# Patient Record
Sex: Female | Born: 2000 | Race: Black or African American | Hispanic: No | Marital: Single | Smoking: Never smoker
Health system: Southern US, Community
[De-identification: ages and names within clinical notes are randomized; demographics above are authoritative.]

## PROBLEM LIST (undated history)

## (undated) DIAGNOSIS — T7840XA Allergy, unspecified, initial encounter: Secondary | ICD-10-CM

## (undated) DIAGNOSIS — D649 Anemia, unspecified: Secondary | ICD-10-CM

## (undated) HISTORY — DX: Anemia, unspecified: D64.9

## (undated) HISTORY — DX: Allergy, unspecified, initial encounter: T78.40XA

---
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
## 2019-02-13 DIAGNOSIS — Z118 Encounter for screening for other infectious and parasitic diseases: Secondary | ICD-10-CM | POA: Diagnosis not present

## 2019-02-13 DIAGNOSIS — Z6822 Body mass index (BMI) 22.0-22.9, adult: Secondary | ICD-10-CM | POA: Diagnosis not present

## 2019-02-13 DIAGNOSIS — N760 Acute vaginitis: Secondary | ICD-10-CM | POA: Diagnosis not present

## 2019-02-13 DIAGNOSIS — Z01419 Encounter for gynecological examination (general) (routine) without abnormal findings: Secondary | ICD-10-CM | POA: Diagnosis not present

## 2019-03-06 DIAGNOSIS — N72 Inflammatory disease of cervix uteri: Secondary | ICD-10-CM | POA: Diagnosis not present

## 2019-03-06 DIAGNOSIS — N760 Acute vaginitis: Secondary | ICD-10-CM | POA: Diagnosis not present

## 2019-03-18 ENCOUNTER — Other Ambulatory Visit: Payer: Self-pay

## 2019-03-18 ENCOUNTER — Ambulatory Visit (HOSPITAL_COMMUNITY)
Admission: EM | Admit: 2019-03-18 | Discharge: 2019-03-18 | Disposition: A | Discharge status: Home / Self Care | Payer: Federal, State, Local not specified - PPO | Attending: Family Medicine | Admitting: Family Medicine

## 2019-03-18 ENCOUNTER — Encounter (HOSPITAL_COMMUNITY): Payer: Self-pay

## 2019-03-18 DIAGNOSIS — Z20828 Contact with and (suspected) exposure to other viral communicable diseases: Secondary | ICD-10-CM | POA: Diagnosis not present

## 2019-03-18 DIAGNOSIS — Z20822 Contact with and (suspected) exposure to covid-19: Secondary | ICD-10-CM

## 2019-03-18 NOTE — ED Triage Notes (Signed)
Pt presents for Covid Testing after someone at her fathers job tested positive for Covid.

## 2019-03-18 NOTE — Discharge Instructions (Signed)
Continue to discuss with your work about policy of asymptomatic possible covid exposure and isolation requirements.  Will notify you of any positive findings from your covid testing. You may monitor your results on your MyChart online as well.

## 2019-03-18 NOTE — ED Provider Notes (Signed)
[facility]    CSN: [id number] Arrival date & time: 03/18/19  0915      History   Chief Complaint Chief Complaint  Patient presents with  . COVID EXPOSURE    HPI Elif Yonts is a 18 y.o. female.   Debroah Loop presents with concerns of exposure to Covid-19. Her father had a coworker who learned of a positive test two days ago. Her father has been feeling unwell, with diarrhea and body aches. She feels normal. Denies any URI symptoms, gi symptoms, fevers, body aches, headaches, loss of taste or smell. She works at [employer], however, therefore would like testing to ensure negative. Without contributing medical history.      ROS per HPI, negative if not otherwise mentioned.      Past Medical History:  Diagnosis Date  . Allergy   . Anemia     Patient Active Problem List   Diagnosis Date Noted  . Seasonal allergic rhinitis due to pollen 02/09/2017    History reviewed. No pertinent surgical history.  OB History   No obstetric history on file.      Home Medications    Prior to Admission medications   Medication Sig Start Date End Date Taking? Authorizing Provider  cetirizine-pseudoephedrine (ZYRTEC-D) 5-120 MG tablet Take 1 tablet by mouth daily. 10/09/18   Tasia Catchings, Amy V, PA-C  doxycycline (VIBRA-TABS) 100 MG tablet Take 1 tablet (100 mg total) by mouth 2 (two) times daily. 11/02/18   Panosh, Standley Brooking, MD  ipratropium (ATROVENT) 0.06 % nasal spray Place 2 sprays into both nostrils 4 (four) times daily. 10/09/18   Ok Edwards, PA-C    Family History Family History  Problem Relation Age of Onset  . Diabetes Mother        type 2   . Heart disease Father         cabd in 8s   . Crohn's disease Maternal Uncle   . Breast cancer Maternal Grandmother     Social History Social History   Tobacco Use  . Smoking status: Never Smoker  . Smokeless tobacco: Never Used  Substance Use Topics  . Alcohol use: No  . Drug use: Not on file      Allergies   Amoxicillin and Penicillins   Review of Systems Review of Systems   Physical Exam Triage Vital Signs ED Triage Vitals  Enc Vitals Group     BP 03/18/19 0947 (!) 122/94     Pulse Rate 03/18/19 0947 85     Resp 03/18/19 0947 17     Temp 03/18/19 0947 99.2 F (37.3 C)     Temp Source 03/18/19 0947 Oral     SpO2 03/18/19 0947 100 %     Weight --      Height --      Head Circumference --      Peak Flow --      Pain Score 03/18/19 0949 0     Pain Loc --      Pain Edu? --      Excl. in [place]? --    No data found.  Updated Vital Signs BP (!) 122/94 (BP Location: Left Arm)   Pulse 85   Temp 99.2 F (37.3 C) (Oral)   Resp 17   LMP 03/17/2019   SpO2 100%   Visual Acuity Right Eye Distance:   Left Eye Distance:   Bilateral Distance:    Right Eye Near:   Left Eye Near:  Bilateral Near:     Physical Exam Constitutional:      General: She is not in acute distress.    Appearance: She is well-developed.  Cardiovascular:     Rate and Rhythm: Normal rate.  Pulmonary:     Effort: Pulmonary effort is normal.  Skin:    General: Skin is warm and dry.  Neurological:     Mental Status: She is alert and oriented to person, place, and time.      UC Treatments / Results  Labs (all labs ordered are listed, but only abnormal results are displayed) Labs Reviewed  NOVEL CORONAVIRUS, NAA (HOSPITAL ORDER, SEND-OUT TO REF LAB)    EKG   Radiology No results found.  Procedures Procedures (including critical care time)  Medications Ordered in UC Medications - No data to display  Initial Impression / Assessment and Plan / UC Course  I have reviewed the triage vital signs and the nursing notes.  Pertinent labs & imaging results that were available during my care of the patient were reviewed by me and considered in my medical decision making (see chart for details).     Non toxic. Benign physical exam.  Asymptomatic. Possible covid-19 exposure, works in  [employer]. Testing provided. Will notify of any positive findings. Return precautions provided. Patient verbalized understanding and agreeable to plan.    Final Clinical Impressions(s) / UC Diagnoses   Final diagnoses:  Exposure to Covid-19 Virus     Discharge Instructions     Continue to discuss with your work about policy of asymptomatic possible covid exposure and isolation requirements.  Will notify you of any positive findings from your covid testing. You may monitor your results on your MyChart online as well.     ED Prescriptions    None     Controlled Substance Prescriptions [place] Controlled Substance Registry consulted? Not Applicable   Georgetta HaberBurky,  B, NP 03/18/19 1015

## 2019-03-20 LAB — NOVEL CORONAVIRUS, NAA (HOSP ORDER, SEND-OUT TO REF LAB; TAT 18-24 HRS): SARS-CoV-2, NAA: NOT DETECTED

## 2019-04-26 DIAGNOSIS — M9903 Segmental and somatic dysfunction of lumbar region: Secondary | ICD-10-CM | POA: Diagnosis not present

## 2019-04-26 DIAGNOSIS — M5386 Other specified dorsopathies, lumbar region: Secondary | ICD-10-CM | POA: Diagnosis not present

## 2019-04-26 DIAGNOSIS — M9902 Segmental and somatic dysfunction of thoracic region: Secondary | ICD-10-CM | POA: Diagnosis not present

## 2019-04-26 DIAGNOSIS — M9904 Segmental and somatic dysfunction of sacral region: Secondary | ICD-10-CM | POA: Diagnosis not present

## 2019-04-29 DIAGNOSIS — N926 Irregular menstruation, unspecified: Secondary | ICD-10-CM | POA: Diagnosis not present

## 2019-04-29 DIAGNOSIS — Z3200 Encounter for pregnancy test, result unknown: Secondary | ICD-10-CM | POA: Diagnosis not present

## 2019-04-29 DIAGNOSIS — Z3689 Encounter for other specified antenatal screening: Secondary | ICD-10-CM | POA: Diagnosis not present

## 2019-04-30 DIAGNOSIS — M9904 Segmental and somatic dysfunction of sacral region: Secondary | ICD-10-CM | POA: Diagnosis not present

## 2019-04-30 DIAGNOSIS — M5386 Other specified dorsopathies, lumbar region: Secondary | ICD-10-CM | POA: Diagnosis not present

## 2019-04-30 DIAGNOSIS — M9902 Segmental and somatic dysfunction of thoracic region: Secondary | ICD-10-CM | POA: Diagnosis not present

## 2019-04-30 DIAGNOSIS — M9903 Segmental and somatic dysfunction of lumbar region: Secondary | ICD-10-CM | POA: Diagnosis not present

## 2019-05-01 DIAGNOSIS — N926 Irregular menstruation, unspecified: Secondary | ICD-10-CM | POA: Diagnosis not present

## 2019-05-02 DIAGNOSIS — M9902 Segmental and somatic dysfunction of thoracic region: Secondary | ICD-10-CM | POA: Diagnosis not present

## 2019-05-02 DIAGNOSIS — M5386 Other specified dorsopathies, lumbar region: Secondary | ICD-10-CM | POA: Diagnosis not present

## 2019-05-02 DIAGNOSIS — M9904 Segmental and somatic dysfunction of sacral region: Secondary | ICD-10-CM | POA: Diagnosis not present

## 2019-05-02 DIAGNOSIS — M9903 Segmental and somatic dysfunction of lumbar region: Secondary | ICD-10-CM | POA: Diagnosis not present

## 2019-05-03 DIAGNOSIS — M5386 Other specified dorsopathies, lumbar region: Secondary | ICD-10-CM | POA: Diagnosis not present

## 2019-05-03 DIAGNOSIS — M9904 Segmental and somatic dysfunction of sacral region: Secondary | ICD-10-CM | POA: Diagnosis not present

## 2019-05-03 DIAGNOSIS — M9903 Segmental and somatic dysfunction of lumbar region: Secondary | ICD-10-CM | POA: Diagnosis not present

## 2019-05-03 DIAGNOSIS — M9902 Segmental and somatic dysfunction of thoracic region: Secondary | ICD-10-CM | POA: Diagnosis not present

## 2019-05-03 DIAGNOSIS — N926 Irregular menstruation, unspecified: Secondary | ICD-10-CM | POA: Diagnosis not present

## 2019-05-07 DIAGNOSIS — M5386 Other specified dorsopathies, lumbar region: Secondary | ICD-10-CM | POA: Diagnosis not present

## 2019-05-07 DIAGNOSIS — M9904 Segmental and somatic dysfunction of sacral region: Secondary | ICD-10-CM | POA: Diagnosis not present

## 2019-05-07 DIAGNOSIS — M9903 Segmental and somatic dysfunction of lumbar region: Secondary | ICD-10-CM | POA: Diagnosis not present

## 2019-05-07 DIAGNOSIS — M9902 Segmental and somatic dysfunction of thoracic region: Secondary | ICD-10-CM | POA: Diagnosis not present

## 2019-05-09 DIAGNOSIS — M9902 Segmental and somatic dysfunction of thoracic region: Secondary | ICD-10-CM | POA: Diagnosis not present

## 2019-05-09 DIAGNOSIS — M9904 Segmental and somatic dysfunction of sacral region: Secondary | ICD-10-CM | POA: Diagnosis not present

## 2019-05-09 DIAGNOSIS — M9903 Segmental and somatic dysfunction of lumbar region: Secondary | ICD-10-CM | POA: Diagnosis not present

## 2019-05-09 DIAGNOSIS — M5386 Other specified dorsopathies, lumbar region: Secondary | ICD-10-CM | POA: Diagnosis not present

## 2019-05-10 DIAGNOSIS — M5386 Other specified dorsopathies, lumbar region: Secondary | ICD-10-CM | POA: Diagnosis not present

## 2019-05-10 DIAGNOSIS — N926 Irregular menstruation, unspecified: Secondary | ICD-10-CM | POA: Diagnosis not present

## 2019-05-10 DIAGNOSIS — M9902 Segmental and somatic dysfunction of thoracic region: Secondary | ICD-10-CM | POA: Diagnosis not present

## 2019-05-10 DIAGNOSIS — M9903 Segmental and somatic dysfunction of lumbar region: Secondary | ICD-10-CM | POA: Diagnosis not present

## 2019-05-10 DIAGNOSIS — M9904 Segmental and somatic dysfunction of sacral region: Secondary | ICD-10-CM | POA: Diagnosis not present

## 2019-05-14 DIAGNOSIS — M9902 Segmental and somatic dysfunction of thoracic region: Secondary | ICD-10-CM | POA: Diagnosis not present

## 2019-05-14 DIAGNOSIS — M9904 Segmental and somatic dysfunction of sacral region: Secondary | ICD-10-CM | POA: Diagnosis not present

## 2019-05-14 DIAGNOSIS — M5386 Other specified dorsopathies, lumbar region: Secondary | ICD-10-CM | POA: Diagnosis not present

## 2019-05-14 DIAGNOSIS — M9903 Segmental and somatic dysfunction of lumbar region: Secondary | ICD-10-CM | POA: Diagnosis not present

## 2019-05-17 DIAGNOSIS — M9902 Segmental and somatic dysfunction of thoracic region: Secondary | ICD-10-CM | POA: Diagnosis not present

## 2019-05-17 DIAGNOSIS — M9904 Segmental and somatic dysfunction of sacral region: Secondary | ICD-10-CM | POA: Diagnosis not present

## 2019-05-17 DIAGNOSIS — M9903 Segmental and somatic dysfunction of lumbar region: Secondary | ICD-10-CM | POA: Diagnosis not present

## 2019-05-17 DIAGNOSIS — M5386 Other specified dorsopathies, lumbar region: Secondary | ICD-10-CM | POA: Diagnosis not present

## 2019-05-20 DIAGNOSIS — Z3201 Encounter for pregnancy test, result positive: Secondary | ICD-10-CM | POA: Diagnosis not present

## 2019-05-28 DIAGNOSIS — M9902 Segmental and somatic dysfunction of thoracic region: Secondary | ICD-10-CM | POA: Diagnosis not present

## 2019-05-28 DIAGNOSIS — M5386 Other specified dorsopathies, lumbar region: Secondary | ICD-10-CM | POA: Diagnosis not present

## 2019-05-28 DIAGNOSIS — M9903 Segmental and somatic dysfunction of lumbar region: Secondary | ICD-10-CM | POA: Diagnosis not present

## 2019-05-28 DIAGNOSIS — M9904 Segmental and somatic dysfunction of sacral region: Secondary | ICD-10-CM | POA: Diagnosis not present

## 2019-05-31 DIAGNOSIS — M9904 Segmental and somatic dysfunction of sacral region: Secondary | ICD-10-CM | POA: Diagnosis not present

## 2019-05-31 DIAGNOSIS — M5386 Other specified dorsopathies, lumbar region: Secondary | ICD-10-CM | POA: Diagnosis not present

## 2019-05-31 DIAGNOSIS — M9903 Segmental and somatic dysfunction of lumbar region: Secondary | ICD-10-CM | POA: Diagnosis not present

## 2019-05-31 DIAGNOSIS — M9902 Segmental and somatic dysfunction of thoracic region: Secondary | ICD-10-CM | POA: Diagnosis not present

## 2019-06-03 DIAGNOSIS — M9904 Segmental and somatic dysfunction of sacral region: Secondary | ICD-10-CM | POA: Diagnosis not present

## 2019-06-03 DIAGNOSIS — M9902 Segmental and somatic dysfunction of thoracic region: Secondary | ICD-10-CM | POA: Diagnosis not present

## 2019-06-03 DIAGNOSIS — M9903 Segmental and somatic dysfunction of lumbar region: Secondary | ICD-10-CM | POA: Diagnosis not present

## 2019-06-03 DIAGNOSIS — M5386 Other specified dorsopathies, lumbar region: Secondary | ICD-10-CM | POA: Diagnosis not present

## 2019-06-11 DIAGNOSIS — M9904 Segmental and somatic dysfunction of sacral region: Secondary | ICD-10-CM | POA: Diagnosis not present

## 2019-06-11 DIAGNOSIS — M9903 Segmental and somatic dysfunction of lumbar region: Secondary | ICD-10-CM | POA: Diagnosis not present

## 2019-06-11 DIAGNOSIS — M5386 Other specified dorsopathies, lumbar region: Secondary | ICD-10-CM | POA: Diagnosis not present

## 2019-06-11 DIAGNOSIS — M9902 Segmental and somatic dysfunction of thoracic region: Secondary | ICD-10-CM | POA: Diagnosis not present

## 2019-06-13 DIAGNOSIS — Z1159 Encounter for screening for other viral diseases: Secondary | ICD-10-CM | POA: Diagnosis not present

## 2019-06-13 DIAGNOSIS — Z114 Encounter for screening for human immunodeficiency virus [HIV]: Secondary | ICD-10-CM | POA: Diagnosis not present

## 2019-06-13 DIAGNOSIS — Z309 Encounter for contraceptive management, unspecified: Secondary | ICD-10-CM | POA: Diagnosis not present

## 2019-06-13 DIAGNOSIS — Z113 Encounter for screening for infections with a predominantly sexual mode of transmission: Secondary | ICD-10-CM | POA: Diagnosis not present

## 2019-06-13 DIAGNOSIS — Z118 Encounter for screening for other infectious and parasitic diseases: Secondary | ICD-10-CM | POA: Diagnosis not present

## 2019-06-13 LAB — HM HEPATITIS C SCREENING LAB: HM Hepatitis Screen: NEGATIVE

## 2019-06-18 DIAGNOSIS — M5386 Other specified dorsopathies, lumbar region: Secondary | ICD-10-CM | POA: Diagnosis not present

## 2019-06-18 DIAGNOSIS — M9903 Segmental and somatic dysfunction of lumbar region: Secondary | ICD-10-CM | POA: Diagnosis not present

## 2019-06-18 DIAGNOSIS — M9904 Segmental and somatic dysfunction of sacral region: Secondary | ICD-10-CM | POA: Diagnosis not present

## 2019-06-18 DIAGNOSIS — M9902 Segmental and somatic dysfunction of thoracic region: Secondary | ICD-10-CM | POA: Diagnosis not present

## 2019-06-20 DIAGNOSIS — Z3689 Encounter for other specified antenatal screening: Secondary | ICD-10-CM | POA: Diagnosis not present

## 2019-06-20 DIAGNOSIS — Z3201 Encounter for pregnancy test, result positive: Secondary | ICD-10-CM | POA: Diagnosis not present

## 2019-06-20 DIAGNOSIS — Z0389 Encounter for observation for other suspected diseases and conditions ruled out: Secondary | ICD-10-CM | POA: Diagnosis not present

## 2019-06-24 DIAGNOSIS — Z3201 Encounter for pregnancy test, result positive: Secondary | ICD-10-CM | POA: Diagnosis not present

## 2019-06-25 DIAGNOSIS — M9903 Segmental and somatic dysfunction of lumbar region: Secondary | ICD-10-CM | POA: Diagnosis not present

## 2019-06-25 DIAGNOSIS — M9904 Segmental and somatic dysfunction of sacral region: Secondary | ICD-10-CM | POA: Diagnosis not present

## 2019-06-25 DIAGNOSIS — M5386 Other specified dorsopathies, lumbar region: Secondary | ICD-10-CM | POA: Diagnosis not present

## 2019-06-25 DIAGNOSIS — M9902 Segmental and somatic dysfunction of thoracic region: Secondary | ICD-10-CM | POA: Diagnosis not present

## 2019-07-02 DIAGNOSIS — M9904 Segmental and somatic dysfunction of sacral region: Secondary | ICD-10-CM | POA: Diagnosis not present

## 2019-07-02 DIAGNOSIS — M9903 Segmental and somatic dysfunction of lumbar region: Secondary | ICD-10-CM | POA: Diagnosis not present

## 2019-07-02 DIAGNOSIS — M9902 Segmental and somatic dysfunction of thoracic region: Secondary | ICD-10-CM | POA: Diagnosis not present

## 2019-07-02 DIAGNOSIS — M5386 Other specified dorsopathies, lumbar region: Secondary | ICD-10-CM | POA: Diagnosis not present

## 2019-07-08 DIAGNOSIS — Z3201 Encounter for pregnancy test, result positive: Secondary | ICD-10-CM | POA: Diagnosis not present

## 2019-07-22 DIAGNOSIS — Z3201 Encounter for pregnancy test, result positive: Secondary | ICD-10-CM | POA: Diagnosis not present

## 2019-08-07 ENCOUNTER — Ambulatory Visit (HOSPITAL_COMMUNITY)
Admission: EM | Admit: 2019-08-07 | Discharge: 2019-08-07 | Disposition: A | Discharge status: Home / Self Care | Payer: Federal, State, Local not specified - PPO | Attending: Family Medicine | Admitting: Family Medicine

## 2019-08-07 ENCOUNTER — Encounter (HOSPITAL_COMMUNITY): Payer: Self-pay | Admitting: Emergency Medicine

## 2019-08-07 ENCOUNTER — Other Ambulatory Visit: Payer: Self-pay

## 2019-08-07 DIAGNOSIS — Z20828 Contact with and (suspected) exposure to other viral communicable diseases: Secondary | ICD-10-CM | POA: Diagnosis not present

## 2019-08-07 DIAGNOSIS — R05 Cough: Secondary | ICD-10-CM | POA: Insufficient documentation

## 2019-08-07 DIAGNOSIS — R067 Sneezing: Secondary | ICD-10-CM | POA: Diagnosis not present

## 2019-08-07 DIAGNOSIS — R059 Cough, unspecified: Secondary | ICD-10-CM

## 2019-08-07 DIAGNOSIS — Z20822 Contact with and (suspected) exposure to covid-19: Secondary | ICD-10-CM

## 2019-08-07 DIAGNOSIS — J029 Acute pharyngitis, unspecified: Secondary | ICD-10-CM

## 2019-08-07 MED ORDER — BENZONATATE 100 MG PO CAPS: 100.0000 mg | ORAL_CAPSULE | Freq: Three times a day (TID) | ORAL | 0 refills | Status: DC

## 2019-08-07 MED ORDER — ALBUTEROL SULFATE HFA 108 (90 BASE) MCG/ACT IN AERS
1.0000 | INHALATION_SPRAY | Freq: Four times a day (QID) | RESPIRATORY_TRACT | 0 refills | Status: DC | PRN
Start: 2019-08-07 — End: 2019-09-09

## 2019-08-07 NOTE — Discharge Instructions (Addendum)
COVID testing ordered.  It will take between 2-7 days for test results.  Someone will contact you regarding abnormal results.     In the meantime: You should remain isolated in your home for 10 days from symptom onset Tessalon Perles prescribed for cough Use medications daily for symptom relief Use OTC medications like ibuprofen or tylenol as needed fever or pain Call or go to the ED if you have any new or worsening symptoms such as fever, worsening cough, shortness of breath, chest tightness, chest pain, turning blue, changes in mental status, etc..Marland Kitchen

## 2019-08-07 NOTE — ED Triage Notes (Signed)
Sore throat started Monday night.  Yesterday sneezing and coughing started.  No known fever

## 2019-08-07 NOTE — ED Provider Notes (Signed)
[facility]    CSN: [id number] Arrival date & time: 08/07/19  1139      History   Chief Complaint Chief Complaint  Patient presents with  . Cough    HPI Helen Fletcher is a 18 y.o. female.   kritika stukes 18 years old presented to the urgent care with a complaint of of sore throat x 3 days coughing and sneezing.. Denies sick exposure to  flu or strep.  Denies recent travel.  Denies aggravating or alleviating symptoms.  Denies previous COVID infection.   Denies fever, fatigue, nasal congestion, rhinorrhea,  SOB, wheezing, chest pain, nausea, vomiting, changes in bowel or bladder habits.   The history is provided by the patient. No language interpreter was used.  Cough Associated symptoms: sore throat     Past Medical History:  Diagnosis Date  . Allergy   . Anemia     Patient Active Problem List   Diagnosis Date Noted  . Seasonal allergic rhinitis due to pollen 02/09/2017    History reviewed. No pertinent surgical history.  OB History   No obstetric history on file.      Home Medications    Prior to Admission medications   Medication Sig Start Date End Date Taking? Authorizing Provider  albuterol (VENTOLIN HFA) 108 (90 Base) MCG/ACT inhaler Inhale 1-2 puffs into the lungs every 6 (six) hours as needed for wheezing or shortness of breath. 08/07/19   Ilisha Blust, Darrelyn Hillock, FNP  benzonatate (TESSALON) 100 MG capsule Take 1 capsule (100 mg total) by mouth every 8 (eight) hours. 08/07/19   Betsi Crespi, Darrelyn Hillock, FNP  cetirizine-pseudoephedrine (ZYRTEC-D) 5-120 MG tablet Take 1 tablet by mouth daily. 10/09/18   Tasia Catchings, Amy V, PA-C  doxycycline (VIBRA-TABS) 100 MG tablet Take 1 tablet (100 mg total) by mouth 2 (two) times daily. 11/02/18   Panosh, Standley Brooking, MD  ipratropium (ATROVENT) 0.06 % nasal spray Place 2 sprays into both nostrils 4 (four) times daily. 10/09/18   Ok Edwards, PA-C    Family History Family History  Problem Relation Age of Onset  . Diabetes  Mother        type 2   . Heart disease Father         cabd in 10s   . Crohn's disease Maternal Uncle   . Breast cancer Maternal Grandmother     Social History Social History   Tobacco Use  . Smoking status: Never Smoker  . Smokeless tobacco: Never Used  Substance Use Topics  . Alcohol use: No  . Drug use: Never     Allergies   Amoxicillin and Penicillins   Review of Systems Review of Systems  Constitutional: Negative.   HENT: Positive for sneezing and sore throat.   Respiratory: Positive for cough.   Cardiovascular: Negative.   Gastrointestinal: Negative.   ROS: all other are negatives  Physical Exam Triage Vital Signs ED Triage Vitals [08/07/19 1225]  Enc Vitals Group     BP      Pulse      Resp      Temp      Temp src      SpO2      Weight      Height      Head Circumference      Peak Flow      Pain Score 0     Pain Loc      Pain Edu?      Excl. in [place]?  No data found.  Updated Vital Signs BP 118/76   Pulse 88   Temp 98.8 F (37.1 C) (Oral)   Resp (!) 22   LMP 07/15/2019   SpO2 100%   Visual Acuity Right Eye Distance:   Left Eye Distance:   Bilateral Distance:    Right Eye Near:   Left Eye Near:    Bilateral Near:     Physical Exam Vitals signs and nursing note reviewed.  Constitutional:      General: She is not in acute distress.    Appearance: Normal appearance. She is normal weight. She is not ill-appearing or toxic-appearing.  HENT:     Head: Normocephalic.     Right Ear: Tympanic membrane and ear canal normal. There is no impacted cerumen.     Left Ear: Tympanic membrane, ear canal and external ear normal. There is no impacted cerumen.  Cardiovascular:     Rate and Rhythm: Normal rate and regular rhythm.     Pulses: Normal pulses.     Heart sounds: Normal heart sounds. No murmur.  Pulmonary:     Effort: No respiratory distress.     Breath sounds: Normal breath sounds. No wheezing or rhonchi.  Abdominal:     General:  Abdomen is flat. Bowel sounds are normal.     Palpations: Abdomen is soft.  Neurological:     Mental Status: She is alert.      UC Treatments / Results  Labs (all labs ordered are listed, but only abnormal results are displayed) Labs Reviewed  NOVEL CORONAVIRUS, NAA (HOSP ORDER, SEND-OUT TO REF LAB; TAT 18-24 HRS)    EKG   Radiology No results found.  Procedures Procedures (including critical care time)  Medications Ordered in UC Medications - No data to display  Initial Impression / Assessment and Plan / UC Course  I have reviewed the triage vital signs and the nursing notes.  Pertinent labs & imaging results that were available during my care of the patient were reviewed by me and considered in my medical decision making (see chart for details).     Patient stable for discharge.  Benign physical exam.  Covid Covid test was completed.  Advised patient to quarantine until results become available.  Will call  if result is abnormal Final Clinical Impressions(s) / UC Diagnoses   Final diagnoses:  Suspected COVID-19 virus infection  Cough     Discharge Instructions     COVID testing ordered.  It will take between 2-7 days for test results.  Someone will contact you regarding abnormal results.     In the meantime: You should remain isolated in your home for 10 days from symptom onset Tessalon Perles prescribed for cough Use medications daily for symptom relief Use OTC medications like ibuprofen or tylenol as needed fever or pain Call or go to the ED if you have any new or worsening symptoms such as fever, worsening cough, shortness of breath, chest tightness, chest pain, turning blue, changes in mental status, etc...    ED Prescriptions    Medication Sig Dispense Auth. Provider   benzonatate (TESSALON) 100 MG capsule Take 1 capsule (100 mg total) by mouth every 8 (eight) hours. 21 capsule Jenavi Beedle S, FNP   albuterol (VENTOLIN HFA) 108 (90 Base) MCG/ACT  inhaler Inhale 1-2 puffs into the lungs every 6 (six) hours as needed for wheezing or shortness of breath. 8 g Durward Parcel, FNP     PDMP not reviewed this encounter.  Durward Parcelvegno, Shantele Reller S, FNP 08/07/19 1251

## 2019-08-09 LAB — NOVEL CORONAVIRUS, NAA (HOSP ORDER, SEND-OUT TO REF LAB; TAT 18-24 HRS): SARS-CoV-2, NAA: NOT DETECTED

## 2019-08-13 DIAGNOSIS — M9903 Segmental and somatic dysfunction of lumbar region: Secondary | ICD-10-CM | POA: Diagnosis not present

## 2019-08-13 DIAGNOSIS — M9902 Segmental and somatic dysfunction of thoracic region: Secondary | ICD-10-CM | POA: Diagnosis not present

## 2019-08-13 DIAGNOSIS — M5386 Other specified dorsopathies, lumbar region: Secondary | ICD-10-CM | POA: Diagnosis not present

## 2019-08-13 DIAGNOSIS — M9904 Segmental and somatic dysfunction of sacral region: Secondary | ICD-10-CM | POA: Diagnosis not present

## 2019-08-20 ENCOUNTER — Encounter: Payer: Self-pay | Admitting: Family Medicine

## 2019-08-20 ENCOUNTER — Ambulatory Visit: Payer: Federal, State, Local not specified - PPO | Admitting: Family Medicine

## 2019-08-20 ENCOUNTER — Other Ambulatory Visit: Payer: Self-pay

## 2019-08-20 VITALS — BP 126/82 | HR 76 | Temp 98.4°F | Wt 115.4 lb

## 2019-08-20 DIAGNOSIS — R519 Headache, unspecified: Secondary | ICD-10-CM

## 2019-08-20 DIAGNOSIS — H43392 Other vitreous opacities, left eye: Secondary | ICD-10-CM

## 2019-08-20 DIAGNOSIS — Z113 Encounter for screening for infections with a predominantly sexual mode of transmission: Secondary | ICD-10-CM | POA: Diagnosis not present

## 2019-08-20 DIAGNOSIS — Z114 Encounter for screening for human immunodeficiency virus [HIV]: Secondary | ICD-10-CM | POA: Diagnosis not present

## 2019-08-20 DIAGNOSIS — Z3689 Encounter for other specified antenatal screening: Secondary | ICD-10-CM | POA: Diagnosis not present

## 2019-08-20 DIAGNOSIS — Z3201 Encounter for pregnancy test, result positive: Secondary | ICD-10-CM | POA: Diagnosis not present

## 2019-08-20 DIAGNOSIS — Z1159 Encounter for screening for other viral diseases: Secondary | ICD-10-CM | POA: Diagnosis not present

## 2019-08-20 DIAGNOSIS — Z118 Encounter for screening for other infectious and parasitic diseases: Secondary | ICD-10-CM | POA: Diagnosis not present

## 2019-08-20 DIAGNOSIS — L292 Pruritus vulvae: Secondary | ICD-10-CM | POA: Diagnosis not present
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
## 2019-08-27 ENCOUNTER — Encounter: Payer: Self-pay | Admitting: Neurology

## 2019-09-01 NOTE — Progress Notes (Signed)
Patient not seen.  Did not respond to attempts to initiate visit.

## 2019-09-02 ENCOUNTER — Other Ambulatory Visit: Payer: Self-pay

## 2019-09-02 ENCOUNTER — Telehealth (INDEPENDENT_AMBULATORY_CARE_PROVIDER_SITE_OTHER): Payer: Federal, State, Local not specified - PPO | Admitting: Neurology

## 2019-09-02 ENCOUNTER — Encounter: Payer: Self-pay | Admitting: Neurology

## 2019-09-09 ENCOUNTER — Encounter (HOSPITAL_COMMUNITY): Payer: Self-pay | Admitting: Obstetrics and Gynecology

## 2019-09-09 ENCOUNTER — Other Ambulatory Visit: Payer: Self-pay

## 2019-09-09 ENCOUNTER — Inpatient Hospital Stay (HOSPITAL_COMMUNITY)
Admission: EM | Admit: 2019-09-09 | Discharge: 2019-09-09 | Disposition: A | Discharge status: Home / Self Care | Payer: Federal, State, Local not specified - PPO | Attending: Obstetrics and Gynecology | Admitting: Obstetrics and Gynecology

## 2019-09-09 ENCOUNTER — Inpatient Hospital Stay (HOSPITAL_COMMUNITY): Payer: Federal, State, Local not specified - PPO

## 2019-09-09 DIAGNOSIS — Z803 Family history of malignant neoplasm of breast: Secondary | ICD-10-CM | POA: Insufficient documentation

## 2019-09-09 DIAGNOSIS — O209 Hemorrhage in early pregnancy, unspecified: Secondary | ICD-10-CM | POA: Diagnosis not present

## 2019-09-09 DIAGNOSIS — R109 Unspecified abdominal pain: Secondary | ICD-10-CM | POA: Diagnosis not present

## 2019-09-09 DIAGNOSIS — O99891 Other specified diseases and conditions complicating pregnancy: Secondary | ICD-10-CM | POA: Insufficient documentation

## 2019-09-09 DIAGNOSIS — Z833 Family history of diabetes mellitus: Secondary | ICD-10-CM | POA: Insufficient documentation

## 2019-09-09 DIAGNOSIS — O4690 Antepartum hemorrhage, unspecified, unspecified trimester: Secondary | ICD-10-CM

## 2019-09-09 DIAGNOSIS — Z3480 Encounter for supervision of other normal pregnancy, unspecified trimester: Secondary | ICD-10-CM

## 2019-09-09 DIAGNOSIS — O200 Threatened abortion: Secondary | ICD-10-CM | POA: Diagnosis not present

## 2019-09-09 DIAGNOSIS — Z880 Allergy status to penicillin: Secondary | ICD-10-CM | POA: Diagnosis not present

## 2019-09-09 DIAGNOSIS — Z3A01 Less than 8 weeks gestation of pregnancy: Secondary | ICD-10-CM

## 2019-09-09 LAB — URINALYSIS, ROUTINE W REFLEX MICROSCOPIC
Bacteria, UA: NONE SEEN
RBC / HPF: >50 RBC/hpf — ABNORMAL HIGH (ref 0–5)

## 2019-09-09 LAB — HCG, QUANTITATIVE, PREGNANCY: hCG, Beta Chain, Quant, S: 43430 m[IU]/mL — ABNORMAL HIGH (ref <5)

## 2019-09-09 LAB — WET PREP, GENITAL
Clue Cells Wet Prep HPF POC: NONE SEEN
Sperm: NONE SEEN
Trich, Wet Prep: NONE SEEN
Yeast Wet Prep HPF POC: NONE SEEN

## 2019-09-09 LAB — CBC
HCT: 36.8 % (ref 36.0–46.0)
Hemoglobin: 12.9 g/dL (ref 12.0–15.0)
MCH: 29 pg (ref 26.0–34.0)
MCHC: 35.1 g/dL (ref 30.0–36.0)
MCV: 82.7 fL (ref 80.0–100.0)
Platelets: 228 10*3/uL (ref 150–400)
RBC: 4.45 MIL/uL (ref 3.87–5.11)
RDW: 12.4 % (ref 11.5–15.5)
WBC: 9.2 10*3/uL (ref 4.0–10.5)
nRBC: 0 % (ref 0.0–0.2)

## 2019-09-09 LAB — ABO/RH: ABO/RH(D): O POS

## 2019-09-09 LAB — POCT PREGNANCY, URINE: Preg Test, Ur: POSITIVE — AB

## 2019-09-09 MED ORDER — ACETAMINOPHEN 500 MG PO TABS
1000.0000 mg | ORAL_TABLET | Freq: Once | ORAL | Status: AC
  Administered 2019-09-09: 1000 mg via ORAL
  Filled 2019-09-09: qty 2

## 2019-09-09 NOTE — MAU Note (Signed)
Pt presents to MAU c/o vaginal bleeding that started after intercourse. Pt reports the blood was on the bed and dripping down her legs. Pt reports she came straight here. Pt is now starting to have some abdominal cramping that is a 6/10.  LMPNovember 24th

## 2019-09-09 NOTE — Discharge Instructions (Signed)
Threatened Miscarriage  A threatened miscarriage occurs when a woman has vaginal bleeding during the first 20 weeks of pregnancy but the pregnancy has not ended. If you have vaginal bleeding during this time, your health care provider will do tests to make sure you are still pregnant. If the tests show that you are still pregnant and that the developing baby (fetus) inside your uterus is still growing, your condition is considered a threatened miscarriage. A threatened miscarriage does not mean your pregnancy will end, but it does increase the risk of losing your pregnancy (complete miscarriage). What are the causes? The cause of this condition is usually not known. For women who go on to have a complete miscarriage, the most common cause is an abnormal number of chromosomes in the developing baby. Chromosomes are the structures inside cells that hold all of a person's genetic material. What increases the risk? The following lifestyle factors may increase your risk of a miscarriage in early pregnancy:  Smoking.  Drinking excessive amounts of alcohol or caffeine.  Recreational drug use. The following preexisting health conditions may increase your risk of a miscarriage in early pregnancy:  Polycystic ovary syndrome.  Uterine fibroids.  Infections.  Diabetes mellitus. What are the signs or symptoms? Symptoms of this condition include:  Vaginal bleeding.  Mild abdominal pain or cramps. How is this diagnosed? If you have bleeding with or without abdominal pain before 20 weeks of pregnancy, your health care provider will do tests to check whether you are still pregnant. These will include:  Ultrasound. This test uses sound waves to create images of the inside of your uterus. This allows your health care provider to look at your developing baby and other structures, such as your placenta.  Pelvic exam. This is an internal exam of your vagina and cervix.  Measurement of your baby's heart  rate.  Laboratory tests such as blood tests, urine tests, or swabs for infection You may be diagnosed with a threatened miscarriage if:  Ultrasound testing shows that you are still pregnant.  Your baby's heart rate is strong.  A pelvic exam shows that the opening between your uterus and your vagina (cervix) is closed.  Blood tests confirm that you are still pregnant. How is this treated? No treatments have been shown to prevent a threatened miscarriage from going on to a complete miscarriage. However, the right home care is important. Follow these instructions at home:  Get plenty of rest.  Do not have sex or use tampons if you have vaginal bleeding.  Do not douche.  Do not smoke or use recreational drugs.  Do not drink alcohol.  Avoid caffeine.  Keep all follow-up prenatal visits as told by your health care provider. This is important. Contact a health care provider if:  You have light vaginal bleeding or spotting while pregnant.  You have abdominal pain or cramping.  You have a fever. Get help right away if:  You have heavy vaginal bleeding.  You have blood clots coming from your vagina.  You pass tissue from your vagina.  You leak fluid, or you have a gush of fluid from your vagina.  You have severe low back pain or abdominal cramps.  You have fever, chills, and severe abdominal pain. Summary  A threatened miscarriage occurs when a woman has vaginal bleeding during the first 20 weeks of pregnancy but the pregnancy has not ended.  The cause of a threatened miscarriage is usually not known.  Symptoms of this condition may   include vaginal bleeding and mild abdominal pain or cramps.  No treatments have been shown to prevent a threatened miscarriage from going on to a complete miscarriage.  Keep all follow-up prenatal visits as told by your health care provider. This is important. This information is not intended to replace advice given to you by your health  care provider. Make sure you discuss any questions you have with your health care provider. Document Revised: 09/21/2017 Document Reviewed: 11/11/2016 Elsevier Patient Education  2020 Elsevier Inc.  [facility] Prenatal Care Providers   Center for [facility]       Phone: [phone]  Center for [facility] [place] Phone: [phone]  Center for [employer] at [place]  Phone: [phone]  Center for [facility] at [facility]  Phone: [phone]  Center for [facility] at [place]  Phone: [phone]  [facility]       Phone: [phone]  [facility]    Phone: [phone]   [facility])    Phone: [phone]  [facility]    Phone: [phone]  [facility]    Phone: [phone]   [facility]  Phone: [phone]  [facility]    Phone: [phone]  [facility]   Phone: [phone]  [facility]    Phone: [phone]

## 2019-09-09 NOTE — MAU Provider Note (Signed)
History     CSN: [id number]  Arrival date and time: 09/09/19 0132   First Provider Initiated Contact with Patient 09/09/19 [phone]      Chief Complaint  Patient presents with  . Vaginal Bleeding  . Abdominal Pain   Helen Fletcher is a 19 y.o. G2P0010 at [redacted]w[redacted]d by July 23, 2019.  She presents today for Vaginal Bleeding and Abdominal Pain.  She states that she started having bleeding around 1am after sexual activity.   Patient states the bleeding was initially dark red and was then a lighter red.  She denies the passing of clots. She states she did require usage of a pad while en route, but the pad was not saturated. She denies pain during sex, but states she is started cramping upon arrival to the MAU. She states the cramping is intermittent and she rates it a 6/10.      OB History    Gravida  2   Para      Term      Preterm      AB  1   Living        SAB      TAB  1   Ectopic      Multiple      Live Births              Past Medical History:  Diagnosis Date  . Allergy   . Anemia     No past surgical history on file.  Family History  Problem Relation Age of Onset  . Diabetes Mother        type 2   . Heart disease Father         cabd in 82s   . Crohn's disease Maternal Uncle   . Breast cancer Maternal Grandmother     Social History   Tobacco Use  . Smoking status: Never Smoker  . Smokeless tobacco: Never Used  Substance Use Topics  . Alcohol use: No  . Drug use: Never    Allergies:  Allergies  Allergen Reactions  . Amoxicillin Rash  . Penicillins Rash    Medications Prior to Admission  Medication Sig Dispense Refill Last Dose  . albuterol (VENTOLIN HFA) 108 (90 Base) MCG/ACT inhaler Inhale 1-2 puffs into the lungs every 6 (six) hours as needed for wheezing or shortness of breath. (Patient not taking: Reported on 08/20/2019) 8 g 0   . benzonatate (TESSALON) 100 MG capsule Take 1 capsule (100 mg total) by mouth every 8 (eight)  hours. (Patient not taking: Reported on 08/20/2019) 21 capsule 0   . cetirizine-pseudoephedrine (ZYRTEC-D) 5-120 MG tablet Take 1 tablet by mouth daily. (Patient not taking: Reported on 08/20/2019) 15 tablet 0   . doxycycline (VIBRA-TABS) 100 MG tablet Take 1 tablet (100 mg total) by mouth 2 (two) times daily. (Patient not taking: Reported on 08/20/2019) 10 tablet 0   . ibuprofen (ADVIL) 200 MG tablet Take 200 mg by mouth every 6 (six) hours as needed.     Marland Kitchen ipratropium (ATROVENT) 0.06 % nasal spray Place 2 sprays into both nostrils 4 (four) times daily. (Patient not taking: Reported on 08/20/2019) 15 mL 0     Review of Systems  Respiratory: Negative for cough and shortness of breath.   Gastrointestinal: Positive for abdominal pain. Negative for constipation, diarrhea, nausea and vomiting.  Genitourinary: Positive for vaginal bleeding. Negative for difficulty urinating, dyspareunia, dysuria, pelvic pain and vaginal discharge.  Musculoskeletal: Negative for back pain.  Physical Exam   Blood pressure 120/75, pulse 89, temperature 98.8 F (37.1 C), temperature source Oral, resp. rate 17, last menstrual period 07/23/2019.  Physical Exam  Constitutional: She is oriented to person, place, and time. She appears well-developed and well-nourished.  HENT:  Head: Normocephalic and atraumatic.  Eyes: Conjunctivae are normal.  Cardiovascular: Normal rate.  Respiratory: Effort normal.  GI: Soft.  Genitourinary: Uterus is enlarged and tender. Cervix exhibits no motion tenderness and no friability. Right adnexum displays no tenderness. Left adnexum displays no tenderness.    Vaginal bleeding present.  There is bleeding in the vagina.    Genitourinary Comments: Speculum Exam: -Normal External Genitalia: Non tender, blood noted at introitus.  -Vaginal Vault: Pink mucosa with good rugae. Moderate amt blood removed with faux swab x 3 -wet prep collected -Cervix:Pink, no lesions, cysts, or polyps.  Appears  open with clot protruding from os.-GC/CT collected -Bimanual Exam:  Size c/w 6-[redacted]wk GA. Tender with palpation.    Musculoskeletal:        General: Normal range of motion.     Cervical back: Normal range of motion.  Neurological: She is alert and oriented to person, place, and time.  Skin: Skin is warm and dry.  Psychiatric: She has a normal mood and affect. Her behavior is normal.    MAU Course  Procedures Results for orders placed or performed during the hospital encounter of 09/09/19 (from the past 24 hour(s))  Urinalysis, Routine w reflex microscopic     Status: Abnormal   Collection Time: 09/09/19  1:48 AM  Result Value Ref Range   Color, Urine RED (A) YELLOW   APPearance TURBID (A) CLEAR   Specific Gravity, Urine  1.005 - 1.030    TEST NOT REPORTED DUE TO COLOR INTERFERENCE OF URINE PIGMENT   pH  5.0 - 8.0    TEST NOT REPORTED DUE TO COLOR INTERFERENCE OF URINE PIGMENT   Glucose, UA (A) NEGATIVE mg/dL    TEST NOT REPORTED DUE TO COLOR INTERFERENCE OF URINE PIGMENT   Hgb urine dipstick (A) NEGATIVE    TEST NOT REPORTED DUE TO COLOR INTERFERENCE OF URINE PIGMENT   Bilirubin Urine (A) NEGATIVE    TEST NOT REPORTED DUE TO COLOR INTERFERENCE OF URINE PIGMENT   Ketones, ur (A) NEGATIVE mg/dL    TEST NOT REPORTED DUE TO COLOR INTERFERENCE OF URINE PIGMENT   Protein, ur (A) NEGATIVE mg/dL    TEST NOT REPORTED DUE TO COLOR INTERFERENCE OF URINE PIGMENT   Nitrite (A) NEGATIVE    TEST NOT REPORTED DUE TO COLOR INTERFERENCE OF URINE PIGMENT   Leukocytes,Ua (A) NEGATIVE    TEST NOT REPORTED DUE TO COLOR INTERFERENCE OF URINE PIGMENT   RBC / HPF >50 (H) 0 - 5 RBC/hpf   WBC, UA 0-5 0 - 5 WBC/hpf   Bacteria, UA NONE SEEN NONE SEEN  Pregnancy, urine POC     Status: Abnormal   Collection Time: 09/09/19  1:54 AM  Result Value Ref Range   Preg Test, Ur POSITIVE (A) NEGATIVE  Wet prep, genital     Status: Abnormal   Collection Time: 09/09/19  2:28 AM  Result Value Ref Range   Yeast Wet  Prep HPF POC NONE SEEN NONE SEEN   Trich, Wet Prep NONE SEEN NONE SEEN   Clue Cells Wet Prep HPF POC NONE SEEN NONE SEEN   WBC, Wet Prep HPF POC FEW (A) NONE SEEN   Sperm NONE SEEN   CBC  Status: None   Collection Time: 09/09/19  2:40 AM  Result Value Ref Range   WBC 9.2 4.0 - 10.5 K/uL   RBC 4.45 3.87 - 5.11 MIL/uL   Hemoglobin 12.9 12.0 - 15.0 g/dL   HCT 28.7 86.7 - 67.2 %   MCV 82.7 80.0 - 100.0 fL   MCH 29.0 26.0 - 34.0 pg   MCHC 35.1 30.0 - 36.0 g/dL   RDW 09.4 70.9 - 62.8 %   Platelets 228 150 - 400 K/uL   nRBC 0.0 0.0 - 0.2 %  ABO/Rh     Status: None   Collection Time: 09/09/19  2:40 AM  Result Value Ref Range   ABO/RH(D) O POS    No rh immune globuloin      NOT A RH IMMUNE GLOBULIN CANDIDATE, PT RH POSITIVE Performed at [facility], [address], [place], [place] [address]    [place] Comp Less 14 Wks  Result Date: 09/09/2019 CLINICAL DATA:  Bleeding EXAM: OBSTETRIC <14 WK [place] AND TRANSVAGINAL OB US TECHNIQUE: Both transabdominal and transvaginal ultrasound examinations were performed for complete evaluation of the gestation as well as the maternal uterus, adnexal regions, and pelvic cul-de-sac. Transvaginal technique was performed to assess early pregnancy. COMPARISON:  None. FINDINGS: Intrauterine gestational sac: Single Yolk sac:  Visualized. Embryo:  Visualized. Cardiac Activity: Visualized. Heart Rate: 105 bpm CRL:  8.0 mm   6 w   5 d                  [place] EDC: 04/29/2020 Subchorionic hemorrhage:  None visualized. Maternal uterus/adnexae: The ovaries are normal in appearance. IMPRESSION: Single live intrauterine pregnancy measuring 6 weeks 5 days. Electronically Signed   By: Jonna Clark M.D.   On: 09/09/2019 03:14    MDM Pelvic Exam; Wet Prep and GC/CT Labs: UA, UPT, CBC, hCG, ABO Ultrasound Pain Medication Assessment and Plan  19 year old  G2P0010 at 6.6weeks Vaginal Bleeding  Abdominal Cramping  -Reviewed POC with patient. -Exam performed and findings  discussed.  -Cultures collected and pending.  -Patient offered and accepts pain medication. Will give tylenol 1gm now. -Labs ordered -Will send for [place] and await results.  Cherre Robins, MSN, CNM 09/09/2019, 2:18 AM   Reassessment (3:26 AM) SIUP at 6.5 weeks O Positive  -[place] results returned as above. -Wet prep returns with insignificant findings. -Results discussed with patient. -Informed that GC/CT will return within 2-3 days. -Patient instructed to initiate pelvic rest until 72 hours after bleeding subsides. -Bleeding Precautions given. -hCG pending -Encouraged to call or return to MAU if symptoms worsen or with the onset of new symptoms. -Discharged to home in stable condition.  Cherre Robins MSN, CNM Advanced Practice Provider, Center for [employer]

## 2019-09-10 LAB — GC/CHLAMYDIA PROBE AMP (~~LOC~~) NOT AT ARMC
Chlamydia: NEGATIVE
Comment: NEGATIVE
Comment: NORMAL
Neisseria Gonorrhea: NEGATIVE

## 2019-09-17 DIAGNOSIS — O209 Hemorrhage in early pregnancy, unspecified: Secondary | ICD-10-CM | POA: Diagnosis not present

## 2019-09-17 DIAGNOSIS — Z3A01 Less than 8 weeks gestation of pregnancy: Secondary | ICD-10-CM | POA: Diagnosis not present

## 2019-09-24 DIAGNOSIS — M9903 Segmental and somatic dysfunction of lumbar region: Secondary | ICD-10-CM | POA: Diagnosis not present

## 2019-09-24 DIAGNOSIS — M9902 Segmental and somatic dysfunction of thoracic region: Secondary | ICD-10-CM | POA: Diagnosis not present

## 2019-09-24 DIAGNOSIS — M9904 Segmental and somatic dysfunction of sacral region: Secondary | ICD-10-CM | POA: Diagnosis not present

## 2019-09-24 DIAGNOSIS — M5386 Other specified dorsopathies, lumbar region: Secondary | ICD-10-CM | POA: Diagnosis not present

## 2019-10-01 DIAGNOSIS — Z3689 Encounter for other specified antenatal screening: Secondary | ICD-10-CM | POA: Diagnosis not present

## 2019-10-01 DIAGNOSIS — Z3401 Encounter for supervision of normal first pregnancy, first trimester: Secondary | ICD-10-CM | POA: Diagnosis not present

## 2019-10-04 DIAGNOSIS — Z3689 Encounter for other specified antenatal screening: Secondary | ICD-10-CM | POA: Diagnosis not present

## 2019-10-04 DIAGNOSIS — Z3401 Encounter for supervision of normal first pregnancy, first trimester: Secondary | ICD-10-CM | POA: Diagnosis not present

## 2019-11-14 DIAGNOSIS — M5386 Other specified dorsopathies, lumbar region: Secondary | ICD-10-CM | POA: Diagnosis not present

## 2019-11-14 DIAGNOSIS — M9904 Segmental and somatic dysfunction of sacral region: Secondary | ICD-10-CM | POA: Diagnosis not present

## 2019-11-14 DIAGNOSIS — M9902 Segmental and somatic dysfunction of thoracic region: Secondary | ICD-10-CM | POA: Diagnosis not present

## 2019-11-14 DIAGNOSIS — M9903 Segmental and somatic dysfunction of lumbar region: Secondary | ICD-10-CM | POA: Diagnosis not present

## 2019-11-26 DIAGNOSIS — Z3009 Encounter for other general counseling and advice on contraception: Secondary | ICD-10-CM | POA: Diagnosis not present

## 2019-11-26 DIAGNOSIS — Z118 Encounter for screening for other infectious and parasitic diseases: Secondary | ICD-10-CM | POA: Diagnosis not present

## 2019-11-26 DIAGNOSIS — N898 Other specified noninflammatory disorders of vagina: Secondary | ICD-10-CM | POA: Diagnosis not present

## 2020-02-24 DIAGNOSIS — Z01419 Encounter for gynecological examination (general) (routine) without abnormal findings: Secondary | ICD-10-CM | POA: Diagnosis not present

## 2020-02-24 DIAGNOSIS — Z6820 Body mass index (BMI) 20.0-20.9, adult: Secondary | ICD-10-CM | POA: Diagnosis not present

## 2020-02-24 DIAGNOSIS — Z118 Encounter for screening for other infectious and parasitic diseases: Secondary | ICD-10-CM | POA: Diagnosis not present

## 2020-04-08 DIAGNOSIS — N898 Other specified noninflammatory disorders of vagina: Secondary | ICD-10-CM | POA: Diagnosis not present

## 2020-04-08 DIAGNOSIS — Z114 Encounter for screening for human immunodeficiency virus [HIV]: Secondary | ICD-10-CM | POA: Diagnosis not present

## 2020-04-08 DIAGNOSIS — Z113 Encounter for screening for infections with a predominantly sexual mode of transmission: Secondary | ICD-10-CM | POA: Diagnosis not present

## 2020-04-19 DIAGNOSIS — R05 Cough: Secondary | ICD-10-CM | POA: Diagnosis not present

## 2020-04-19 DIAGNOSIS — Z20822 Contact with and (suspected) exposure to covid-19: Secondary | ICD-10-CM | POA: Diagnosis not present

## 2020-06-30 DIAGNOSIS — M5386 Other specified dorsopathies, lumbar region: Secondary | ICD-10-CM | POA: Diagnosis not present

## 2020-06-30 DIAGNOSIS — M9905 Segmental and somatic dysfunction of pelvic region: Secondary | ICD-10-CM | POA: Diagnosis not present

## 2020-06-30 DIAGNOSIS — M9902 Segmental and somatic dysfunction of thoracic region: Secondary | ICD-10-CM | POA: Diagnosis not present

## 2020-06-30 DIAGNOSIS — M9903 Segmental and somatic dysfunction of lumbar region: Secondary | ICD-10-CM | POA: Diagnosis not present

## 2020-07-06 DIAGNOSIS — M9905 Segmental and somatic dysfunction of pelvic region: Secondary | ICD-10-CM | POA: Diagnosis not present

## 2020-07-06 DIAGNOSIS — M9903 Segmental and somatic dysfunction of lumbar region: Secondary | ICD-10-CM | POA: Diagnosis not present

## 2020-07-06 DIAGNOSIS — M5386 Other specified dorsopathies, lumbar region: Secondary | ICD-10-CM | POA: Diagnosis not present

## 2020-07-06 DIAGNOSIS — M9902 Segmental and somatic dysfunction of thoracic region: Secondary | ICD-10-CM | POA: Diagnosis not present

## 2020-07-13 DIAGNOSIS — M9902 Segmental and somatic dysfunction of thoracic region: Secondary | ICD-10-CM | POA: Diagnosis not present

## 2020-07-13 DIAGNOSIS — M9905 Segmental and somatic dysfunction of pelvic region: Secondary | ICD-10-CM | POA: Diagnosis not present

## 2020-07-13 DIAGNOSIS — M9903 Segmental and somatic dysfunction of lumbar region: Secondary | ICD-10-CM | POA: Diagnosis not present

## 2020-07-13 DIAGNOSIS — M5386 Other specified dorsopathies, lumbar region: Secondary | ICD-10-CM | POA: Diagnosis not present

## 2020-07-17 DIAGNOSIS — Z113 Encounter for screening for infections with a predominantly sexual mode of transmission: Secondary | ICD-10-CM | POA: Diagnosis not present

## 2020-07-17 DIAGNOSIS — Z114 Encounter for screening for human immunodeficiency virus [HIV]: Secondary | ICD-10-CM | POA: Diagnosis not present

## 2020-07-27 DIAGNOSIS — M9905 Segmental and somatic dysfunction of pelvic region: Secondary | ICD-10-CM | POA: Diagnosis not present

## 2020-07-27 DIAGNOSIS — M5386 Other specified dorsopathies, lumbar region: Secondary | ICD-10-CM | POA: Diagnosis not present

## 2020-07-27 DIAGNOSIS — M9902 Segmental and somatic dysfunction of thoracic region: Secondary | ICD-10-CM | POA: Diagnosis not present

## 2020-07-27 DIAGNOSIS — M9903 Segmental and somatic dysfunction of lumbar region: Secondary | ICD-10-CM | POA: Diagnosis not present

## 2020-08-10 DIAGNOSIS — M9903 Segmental and somatic dysfunction of lumbar region: Secondary | ICD-10-CM | POA: Diagnosis not present

## 2020-08-10 DIAGNOSIS — M5386 Other specified dorsopathies, lumbar region: Secondary | ICD-10-CM | POA: Diagnosis not present

## 2020-08-10 DIAGNOSIS — M9905 Segmental and somatic dysfunction of pelvic region: Secondary | ICD-10-CM | POA: Diagnosis not present

## 2020-08-10 DIAGNOSIS — M9902 Segmental and somatic dysfunction of thoracic region: Secondary | ICD-10-CM | POA: Diagnosis not present

## 2020-08-15 DIAGNOSIS — M9902 Segmental and somatic dysfunction of thoracic region: Secondary | ICD-10-CM | POA: Diagnosis not present

## 2020-08-15 DIAGNOSIS — M5386 Other specified dorsopathies, lumbar region: Secondary | ICD-10-CM | POA: Diagnosis not present

## 2020-08-15 DIAGNOSIS — M9903 Segmental and somatic dysfunction of lumbar region: Secondary | ICD-10-CM | POA: Diagnosis not present

## 2020-08-15 DIAGNOSIS — M9905 Segmental and somatic dysfunction of pelvic region: Secondary | ICD-10-CM | POA: Diagnosis not present

## 2020-09-07 DIAGNOSIS — M9902 Segmental and somatic dysfunction of thoracic region: Secondary | ICD-10-CM | POA: Diagnosis not present

## 2020-09-07 DIAGNOSIS — M9903 Segmental and somatic dysfunction of lumbar region: Secondary | ICD-10-CM | POA: Diagnosis not present

## 2020-09-07 DIAGNOSIS — M9905 Segmental and somatic dysfunction of pelvic region: Secondary | ICD-10-CM | POA: Diagnosis not present

## 2020-09-07 DIAGNOSIS — M5386 Other specified dorsopathies, lumbar region: Secondary | ICD-10-CM | POA: Diagnosis not present

## 2020-09-21 DIAGNOSIS — M9902 Segmental and somatic dysfunction of thoracic region: Secondary | ICD-10-CM | POA: Diagnosis not present

## 2020-09-21 DIAGNOSIS — L02216 Cutaneous abscess of umbilicus: Secondary | ICD-10-CM | POA: Diagnosis not present

## 2020-09-21 DIAGNOSIS — M9905 Segmental and somatic dysfunction of pelvic region: Secondary | ICD-10-CM | POA: Diagnosis not present

## 2020-09-21 DIAGNOSIS — M9903 Segmental and somatic dysfunction of lumbar region: Secondary | ICD-10-CM | POA: Diagnosis not present

## 2020-09-21 DIAGNOSIS — Z20822 Contact with and (suspected) exposure to covid-19: Secondary | ICD-10-CM | POA: Diagnosis not present

## 2020-09-21 DIAGNOSIS — M5386 Other specified dorsopathies, lumbar region: Secondary | ICD-10-CM | POA: Diagnosis not present

## 2020-11-23 DIAGNOSIS — M9903 Segmental and somatic dysfunction of lumbar region: Secondary | ICD-10-CM | POA: Diagnosis not present

## 2020-11-23 DIAGNOSIS — M9905 Segmental and somatic dysfunction of pelvic region: Secondary | ICD-10-CM | POA: Diagnosis not present

## 2020-11-23 DIAGNOSIS — N760 Acute vaginitis: Secondary | ICD-10-CM | POA: Diagnosis not present

## 2020-11-23 DIAGNOSIS — M5386 Other specified dorsopathies, lumbar region: Secondary | ICD-10-CM | POA: Diagnosis not present

## 2020-11-23 DIAGNOSIS — Z3202 Encounter for pregnancy test, result negative: Secondary | ICD-10-CM | POA: Diagnosis not present

## 2020-11-23 DIAGNOSIS — M9902 Segmental and somatic dysfunction of thoracic region: Secondary | ICD-10-CM | POA: Diagnosis not present

## 2020-11-23 DIAGNOSIS — Z113 Encounter for screening for infections with a predominantly sexual mode of transmission: Secondary | ICD-10-CM | POA: Diagnosis not present

## 2021-03-23 DIAGNOSIS — Z113 Encounter for screening for infections with a predominantly sexual mode of transmission: Secondary | ICD-10-CM | POA: Diagnosis not present

## 2021-03-23 DIAGNOSIS — B373 Candidiasis of vulva and vagina: Secondary | ICD-10-CM | POA: Diagnosis not present

## 2021-03-23 DIAGNOSIS — Z114 Encounter for screening for human immunodeficiency virus [HIV]: Secondary | ICD-10-CM | POA: Diagnosis not present

## 2021-05-18 DIAGNOSIS — Z114 Encounter for screening for human immunodeficiency virus [HIV]: Secondary | ICD-10-CM | POA: Diagnosis not present

## 2021-05-18 DIAGNOSIS — Z113 Encounter for screening for infections with a predominantly sexual mode of transmission: Secondary | ICD-10-CM | POA: Diagnosis not present

## 2021-08-10 DIAGNOSIS — Z113 Encounter for screening for infections with a predominantly sexual mode of transmission: Secondary | ICD-10-CM | POA: Diagnosis not present

## 2021-08-10 DIAGNOSIS — Z114 Encounter for screening for human immunodeficiency virus [HIV]: Secondary | ICD-10-CM | POA: Diagnosis not present

## 2021-09-04 ENCOUNTER — Ambulatory Visit: Admission: EM | Admit: 2021-09-04 | Disposition: A | Discharge status: Home / Self Care | Payer: Self-pay

## 2021-09-04 ENCOUNTER — Other Ambulatory Visit: Payer: Self-pay

## 2021-09-04 ENCOUNTER — Ambulatory Visit
Admission: EM | Admit: 2021-09-04 | Discharge: 2021-09-04 | Disposition: A | Discharge status: Home / Self Care | Payer: 59

## 2021-09-17 DIAGNOSIS — Z113 Encounter for screening for infections with a predominantly sexual mode of transmission: Secondary | ICD-10-CM | POA: Diagnosis not present

## 2021-11-08 DIAGNOSIS — Z114 Encounter for screening for human immunodeficiency virus [HIV]: Secondary | ICD-10-CM | POA: Diagnosis not present

## 2021-11-08 DIAGNOSIS — Z113 Encounter for screening for infections with a predominantly sexual mode of transmission: Secondary | ICD-10-CM | POA: Diagnosis not present

## 2021-11-11 ENCOUNTER — Telehealth: Payer: Self-pay

## 2021-11-11 ENCOUNTER — Ambulatory Visit
Admission: EM | Admit: 2021-11-11 | Discharge: 2021-11-11 | Disposition: A | Discharge status: Home / Self Care | Payer: Federal, State, Local not specified - PPO | Attending: Internal Medicine | Admitting: Internal Medicine

## 2021-11-11 ENCOUNTER — Encounter: Payer: Self-pay | Admitting: Emergency Medicine

## 2021-11-11 ENCOUNTER — Other Ambulatory Visit: Payer: Self-pay

## 2021-11-11 DIAGNOSIS — G4700 Insomnia, unspecified: Secondary | ICD-10-CM

## 2021-11-11 DIAGNOSIS — G43709 Chronic migraine without aura, not intractable, without status migrainosus: Secondary | ICD-10-CM | POA: Diagnosis not present

## 2021-11-11 LAB — POCT URINE PREGNANCY: Preg Test, Ur: NEGATIVE

## 2021-11-11 MED ORDER — HYDROXYZINE HCL 25 MG PO TABS: 25.0000 mg | ORAL_TABLET | Freq: Every evening | ORAL | 0 refills | Status: DC | PRN

## 2021-11-11 MED ORDER — KETOROLAC TROMETHAMINE 30 MG/ML IJ SOLN
30.0000 mg | Freq: Once | INTRAMUSCULAR | Status: AC
  Administered 2021-11-11: 30 mg via INTRAMUSCULAR

## 2021-11-11 MED ORDER — DEXAMETHASONE SODIUM PHOSPHATE 10 MG/ML IJ SOLN
10.0000 mg | Freq: Once | INTRAMUSCULAR | Status: AC
  Administered 2021-11-11: 10 mg via INTRAMUSCULAR

## 2021-11-11 NOTE — ED Triage Notes (Signed)
Reports difficulty sleeping.  Patient reports feeling sleepy, but unable to follow sleep.  Reports having headaches all her life.  Patient rates this is typical.  Patient does have a pcp and has talked to them about this.  Patient was told to record headache start stop and log of events.  Patient says this is just too much.  Takes ibuprofen for headache pain.   ? ?Patient currently texting on her phone during nurse assessment/in take ?

## 2021-11-11 NOTE — ED Provider Notes (Signed)
?[facility] ? ? ? ?CSN: [id number] ?Arrival date & time: 11/11/21  1020 ? ? ?  ? ?History   ?Chief Complaint ?Chief Complaint  ?Patient presents with  ? loss of sleep  ? ? ?HPI ?Helen Fletcher is a 21 y.o. female.  ? ?Patient presents today due to complaints of headache and insomnia.  Patient reports that she has had chronic headaches ever since she was a child.  She reports they have seemed to get worse lately and she has been having a headache approximately every other day.  Headache is present in the posterior head.  She typically takes ibuprofen that provides some improvement.  Current headache started last night and she has had some associated dizziness and nausea which is typical for her headaches.  Denies any current nausea.  Denies any blurry vision.  Denies any recent head trauma.  Patient reports that she has never seen a neurologist or headache clinic for chronic headaches.  She is followed by PCP for chronic headaches and was advised by PCP to make a headache journal but patient reports that she has not been doing this. ? ?Patient also reports difficulty sleeping at night that started approximately 1 week ago.  Patient reports that she does not get much sleep at night and if she falls asleep it is typically around 4 AM.  She reports that she thinks that she is stressed about school but is not sure the reason for her insomnia.  She has not taken any medications such as melatonin to help alleviate difficulty sleeping.  Patient reports that she has tried to do sleep hygiene techniques including not looking at her screen that has not been helpful. ? ? ? ?Past Medical History:  ?Diagnosis Date  ? Allergy   ? Anemia   ? ? ?Patient Active Problem List  ? Diagnosis Date Noted  ? Seasonal allergic rhinitis due to pollen 02/09/2017  ? ? ?History reviewed. No pertinent surgical history. ? ?OB History   ? ? Gravida  ?2  ? Para  ?   ? Term  ?   ? Preterm  ?   ? AB  ?1  ? Living  ?   ?  ? ? SAB  ?    ? IAB  ?1  ? Ectopic  ?   ? Multiple  ?   ? Live Births  ?   ?   ?  ?  ? ? ? ?Home Medications   ? ?Prior to Admission medications   ?Medication Sig Start Date End Date Taking? Authorizing Provider  ?hydrOXYzine (ATARAX) 25 MG tablet Take 1 tablet (25 mg total) by mouth at bedtime as needed (insomnia). 11/11/21  Yes Teodora Medici, FNP  ? ? ?Family History ?Family History  ?Problem Relation Age of Onset  ? Diabetes Mother   ?     type 2   ? Heart disease Father   ?      cabd in 67s   ? Crohn's disease Maternal Uncle   ? Breast cancer Maternal Grandmother   ? ? ?Social History ?Social History  ? ?Tobacco Use  ? Smoking status: Never  ? Smokeless tobacco: Never  ?Vaping Use  ? Vaping Use: Never used  ?Substance Use Topics  ? Alcohol use: Yes  ? Drug use: Never  ? ? ? ?Allergies   ?Amoxicillin and Penicillins ? ? ?Review of Systems ?Review of Systems ?Per HPI ? ?Physical Exam ?Triage Vital Signs ?ED Triage Vitals  ?  Enc Vitals Group  ?   BP 11/11/21 1111 118/80  ?   Pulse Rate 11/11/21 1111 83  ?   Resp 11/11/21 1111 16  ?   Temp 11/11/21 1111 98.3 ?F (36.8 ?C)  ?   Temp Source 11/11/21 1111 Oral  ?   SpO2 11/11/21 1111 97 %  ?   Weight --   ?   Height --   ?   Head Circumference --   ?   Peak Flow --   ?   Pain Score 11/11/21 1109 6  ?   Pain Loc --   ?   Pain Edu? --   ?   Excl. in [place]? --   ? ?No data found. ? ?Updated Vital Signs ?BP 118/80 (BP Location: Left Arm)   Pulse 83   Temp 98.3 ?F (36.8 ?C) (Oral)   Resp 16   LMP 10/21/2021 (Exact Date)   SpO2 97%  ? ?Visual Acuity ?Right Eye Distance:   ?Left Eye Distance:   ?Bilateral Distance:   ? ?Right Eye Near:   ?Left Eye Near:    ?Bilateral Near:    ? ?Physical Exam ?Constitutional:   ?   General: She is not in acute distress. ?   Appearance: Normal appearance. She is not toxic-appearing or diaphoretic.  ?HENT:  ?   Head: Normocephalic and atraumatic.  ?Eyes:  ?   Extraocular Movements: Extraocular movements intact.  ?   Conjunctiva/sclera: Conjunctivae normal.   ?   Pupils: Pupils are equal, round, and reactive to light.  ?Cardiovascular:  ?   Rate and Rhythm: Normal rate and regular rhythm.  ?   Pulses: Normal pulses.  ?   Heart sounds: Normal heart sounds.  ?Pulmonary:  ?   Effort: Pulmonary effort is normal. No respiratory distress.  ?   Breath sounds: Normal breath sounds.  ?Neurological:  ?   General: No focal deficit present.  ?   Mental Status: She is alert and oriented to person, place, and time. Mental status is at baseline.  ?   Cranial Nerves: Cranial nerves 2-12 are intact.  ?   Sensory: Sensation is intact.  ?   Motor: Motor function is intact.  ?   Coordination: Coordination is intact.  ?   Gait: Gait is intact.  ?Psychiatric:     ?   Mood and Affect: Mood normal.     ?   Behavior: Behavior normal.     ?   Thought Content: Thought content normal.     ?   Judgment: Judgment normal.  ? ? ? ?UC Treatments / Results  ?Labs ?(all labs ordered are listed, but only abnormal results are displayed) ?Labs Reviewed  ?POCT URINE PREGNANCY  ? ? ?EKG ? ? ?Radiology ?No results found. ? ?Procedures ?Procedures (including critical care time) ? ?Medications Ordered in UC ?Medications  ?ketorolac (TORADOL) 30 MG/ML injection 30 mg (30 mg Intramuscular Given 11/11/21 1148)  ?dexamethasone (DECADRON) injection 10 mg (10 mg Intramuscular Given 11/11/21 1150)  ? ? ?Initial Impression / Assessment and Plan / UC Course  ?I have reviewed the triage vital signs and the nursing notes. ? ?Pertinent labs & imaging results that were available during my care of the patient were reviewed by me and considered in my medical decision making (see chart for details). ? ?  ? ?Physical exam appears benign and neuro exam is normal.  Suspect patient's headache is due to her chronic headaches/migraines given that it is typical  for her regular symptoms.  Do not think that any additional testing or head imaging is needed at this time.  Patient advised it will be beneficial for her to follow-up with  headache clinic/neurologist for further evaluation and management of chronic headaches.  Patient provided with contact information.  Ketorolac IM and Decadron IM administered in urgent care today to alleviate current headache.  Patient advised to avoid NSAIDs for at least 24 hours following injection. ? ?Will prescribe hydroxyzine as a short course for patient to take as needed at night for insomnia.  Advised patient this medication causes drowsiness and to not take any other sedating medications along with this.  Discussed sleep hygiene techniques as well.  Patient advised to follow-up with primary care doctor for further evaluation and management of chronic headaches as well as insomnia.  Patient verbalized understanding and was agreeable with plan. ?Final Clinical Impressions(s) / UC Diagnoses  ? ?Final diagnoses:  ?Chronic migraine without aura without status migrainosus, not intractable  ?Insomnia, unspecified type  ? ? ? ?Discharge Instructions   ? ?  ?You were given 2 injections in urgent care today to help alleviate your headache.  Please follow-up with headache clinic for further evaluation and management.  You are also prescribed a medication to take at night as needed for insomnia.  Please do not take any additional sedating medications with this.  Follow-up with primary care doctor for further evaluation and management. ? ? ? ?ED Prescriptions   ? ? Medication Sig Dispense Auth. Provider  ? hydrOXYzine (ATARAX) 25 MG tablet Take 1 tablet (25 mg total) by mouth at bedtime as needed (insomnia). 12 tablet Oswaldo Conroy E, [place]  ? ?  ? ?PDMP not reviewed this encounter. ?  ?Teodora Medici, [place] ?11/11/21 1202 ? ?

## 2021-11-11 NOTE — Telephone Encounter (Signed)
Per patient request PCP changed to Dr. Regis Bill ?

## 2021-11-11 NOTE — Discharge Instructions (Signed)
You were given 2 injections in urgent care today to help alleviate your headache.  Please follow-up with headache clinic for further evaluation and management.  You are also prescribed a medication to take at night as needed for insomnia.  Please do not take any additional sedating medications with this.  Follow-up with primary care doctor for further evaluation and management. ?
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
## 2021-11-16 ENCOUNTER — Encounter: Payer: Self-pay | Admitting: Internal Medicine

## 2021-11-16 ENCOUNTER — Ambulatory Visit (INDEPENDENT_AMBULATORY_CARE_PROVIDER_SITE_OTHER): Payer: Federal, State, Local not specified - PPO | Admitting: Internal Medicine

## 2021-11-16 VITALS — BP 110/70 | HR 84 | Temp 98.6°F | Ht 63.0 in | Wt 126.8 lb

## 2021-11-16 DIAGNOSIS — R519 Headache, unspecified: Secondary | ICD-10-CM

## 2021-11-16 DIAGNOSIS — G479 Sleep disorder, unspecified: Secondary | ICD-10-CM

## 2021-11-16 DIAGNOSIS — G8929 Other chronic pain: Secondary | ICD-10-CM | POA: Diagnosis not present

## 2021-11-16 DIAGNOSIS — F4320 Adjustment disorder, unspecified: Secondary | ICD-10-CM

## 2021-11-16 DIAGNOSIS — Z0000 Encounter for general adult medical examination without abnormal findings: Secondary | ICD-10-CM

## 2021-11-16 DIAGNOSIS — F439 Reaction to severe stress, unspecified: Secondary | ICD-10-CM

## 2021-11-16 NOTE — Patient Instructions (Addendum)
Good to see you today . ? ?Follow though with  headache appt.  ?Calendar headaches and sleep in interim .  ?Sometimes meds for headaches  help sleep. ?Concentration  decrease can occur with poor sleep  stress anxiety  and other causes. So begin with counselor   and headache help.  ? ? ? ?Referral to counseling  advised for  sleep  events  adjustment concentration.  ?[email or web address] ?[address] ?[place], [place] [address] ?[place]: [phone] ?Fax: [phone] ? ?Lab today   plan follow up if  not getting  help as planned  ?Some physical activity may help .  ? ? ? ? ? ? ?

## 2021-11-17 LAB — HEPATIC FUNCTION PANEL
ALT: 12 U/L (ref 0–35)
AST: 17 U/L (ref 0–37)
Albumin: 4.1 g/dL (ref 3.5–5.2)
Alkaline Phosphatase: 58 U/L (ref 39–117)
Bilirubin, Direct: 0.1 mg/dL (ref 0.0–0.3)
Total Bilirubin: 0.2 mg/dL (ref 0.2–1.2)
Total Protein: 6.7 g/dL (ref 6.0–8.3)

## 2021-11-17 LAB — CBC WITH DIFFERENTIAL/PLATELET
Basophils Absolute: 0.1 10*3/uL (ref 0.0–0.1)
Basophils Relative: 1.3 % (ref 0.0–3.0)
Eosinophils Absolute: 0.1 10*3/uL (ref 0.0–0.7)
Eosinophils Relative: 2 % (ref 0.0–5.0)
HCT: 36.2 % (ref 36.0–46.0)
Hemoglobin: 12.5 g/dL (ref 12.0–15.0)
Lymphocytes Relative: 35.7 % (ref 12.0–46.0)
Lymphs Abs: 2 10*3/uL (ref 0.7–4.0)
MCHC: 34.5 g/dL (ref 30.0–36.0)
MCV: 82.5 fl (ref 78.0–100.0)
Monocytes Absolute: 0.6 10*3/uL (ref 0.1–1.0)
Monocytes Relative: 10.7 % (ref 3.0–12.0)
Neutro Abs: 2.8 10*3/uL (ref 1.4–7.7)
Neutrophils Relative %: 50.3 % (ref 43.0–77.0)
Platelets: 223 10*3/uL (ref 150.0–400.0)
RBC: 4.4 Mil/uL (ref 3.87–5.11)
RDW: 13.3 % (ref 11.5–15.5)
WBC: 5.6 10*3/uL (ref 4.0–10.5)

## 2021-11-17 LAB — BASIC METABOLIC PANEL
BUN: 14 mg/dL (ref 6–23)
CO2: 29 mEq/L (ref 19–32)
Calcium: 9.4 mg/dL (ref 8.4–10.5)
Chloride: 101 mEq/L (ref 96–112)
Creatinine, Ser: 0.91 mg/dL (ref 0.40–1.20)
GFR: 90.02 mL/min (ref >60.00)
Glucose, Bld: 79 mg/dL (ref 70–99)
Potassium: 4.3 mEq/L (ref 3.5–5.1)
Sodium: 135 mEq/L (ref 135–145)

## 2021-11-17 LAB — HEMOGLOBIN A1C: Hgb A1c MFr Bld: 5.9 % (ref 4.6–6.5)

## 2021-11-17 LAB — LIPID PANEL
Cholesterol: 291 mg/dL — ABNORMAL HIGH (ref 0–200)
HDL: 59.9 mg/dL (ref >39.00)
LDL Cholesterol: 202 mg/dL — ABNORMAL HIGH (ref 0–99)
NonHDL: 231.05
Total CHOL/HDL Ratio: 5
Triglycerides: 146 mg/dL (ref 0.0–149.0)
VLDL: 29.2 mg/dL (ref 0.0–40.0)

## 2021-11-17 LAB — SEDIMENTATION RATE: Sed Rate: 8 mm/hr (ref 0–20)

## 2021-11-17 LAB — TSH: TSH: 0.57 u[IU]/mL (ref 0.35–5.50)

## 2021-11-19 NOTE — Progress Notes (Signed)
Cholesterol is very high .. but sugar kidney liver blood count are normal.    Please attend to best eating heart healthy  Mediterranean diet or plant based  and exercise.   I advise we repeat   at  your follow up visit  in June .  This elevation could be hereditary but want to see if comes down with lifestyle interventions.  Get appt for  and place order for  fasting   FLD  before visit  (

## 2021-11-23 ENCOUNTER — Other Ambulatory Visit: Payer: Self-pay

## 2021-11-23 DIAGNOSIS — Z0000 Encounter for general adult medical examination without abnormal findings: Secondary | ICD-10-CM

## 2021-11-24 ENCOUNTER — Other Ambulatory Visit: Payer: Self-pay

## 2021-11-24 DIAGNOSIS — E7800 Pure hypercholesterolemia, unspecified: Secondary | ICD-10-CM

## 2021-11-29 DIAGNOSIS — G43719 Chronic migraine without aura, intractable, without status migrainosus: Secondary | ICD-10-CM | POA: Diagnosis not present

## 2021-11-29 DIAGNOSIS — Z049 Encounter for examination and observation for unspecified reason: Secondary | ICD-10-CM | POA: Diagnosis not present

## 2021-11-29 IMAGING — US US OB COMP LESS 14 WK
1 series · 16 of 24 positions shown · non-contrast
Comparison: None.

CLINICAL DATA: Bleeding

EXAM:
OBSTETRIC <14 WK US AND TRANSVAGINAL OB US
TECHNIQUE: Both transabdominal and transvaginal ultrasound examinations were
performed for complete evaluation of the gestation as well as the
maternal uterus, adnexal regions, and pelvic cul-de-sac.
Transvaginal technique was performed to assess early pregnancy.

[Series 1: us ob comp less 14 wk · 24 acquisitions, 16 frames shown]
[im 1/24]
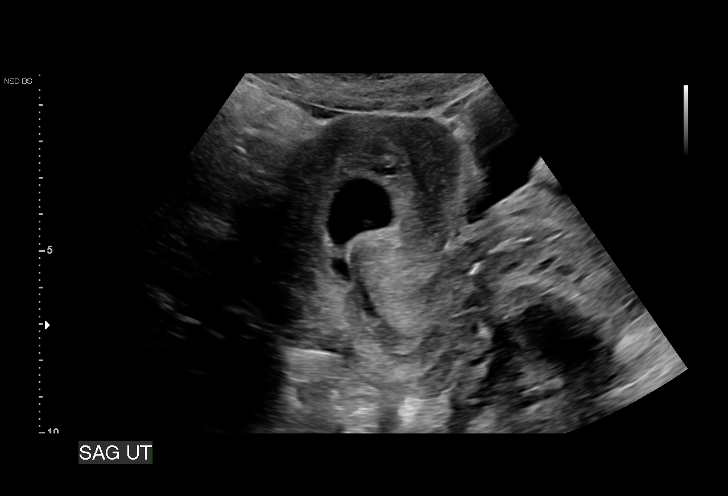
[im 3/24]
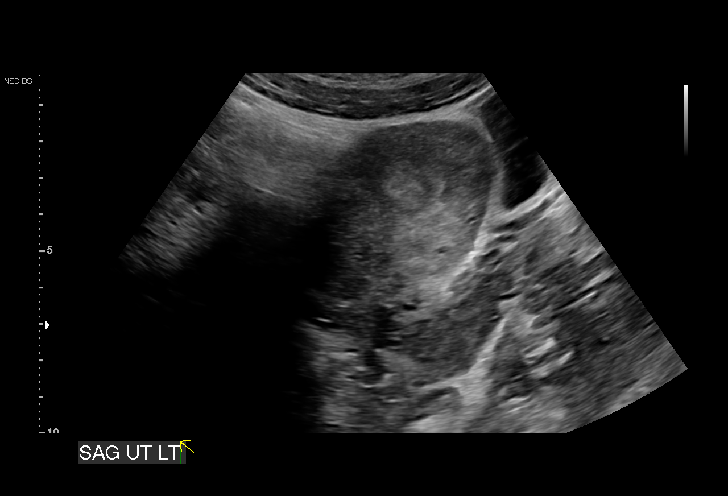
[im 4/24]
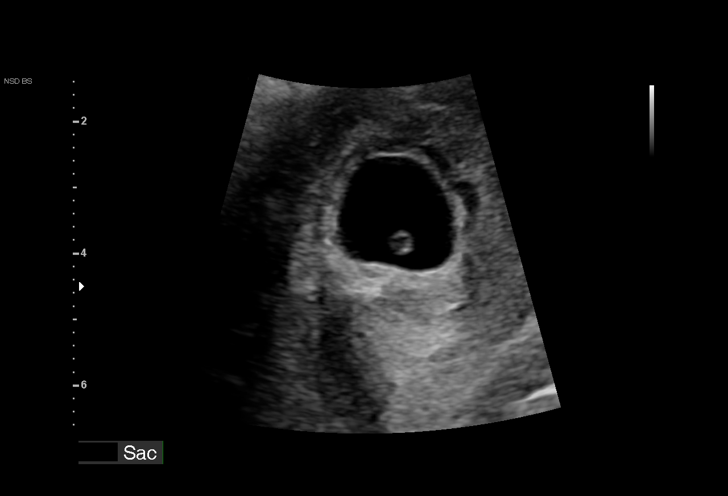
[im 6/24]
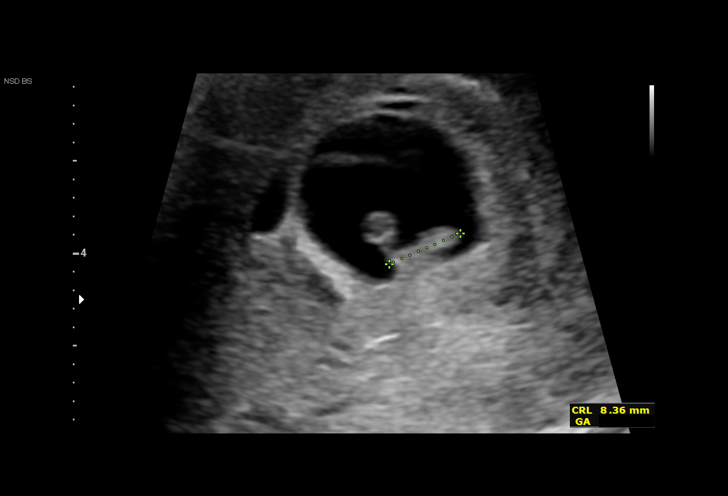
[im 7/24]
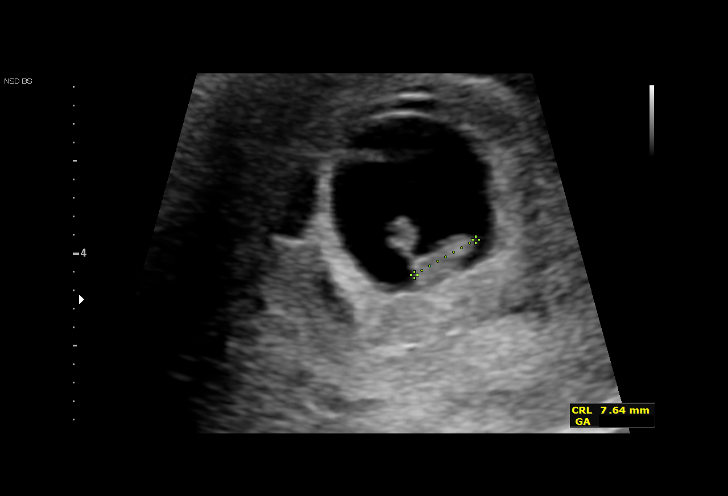
[im 9/24]
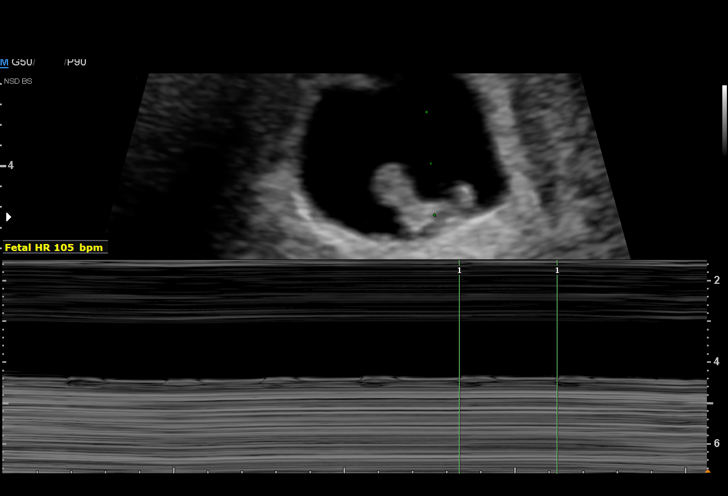
[im 10/24]
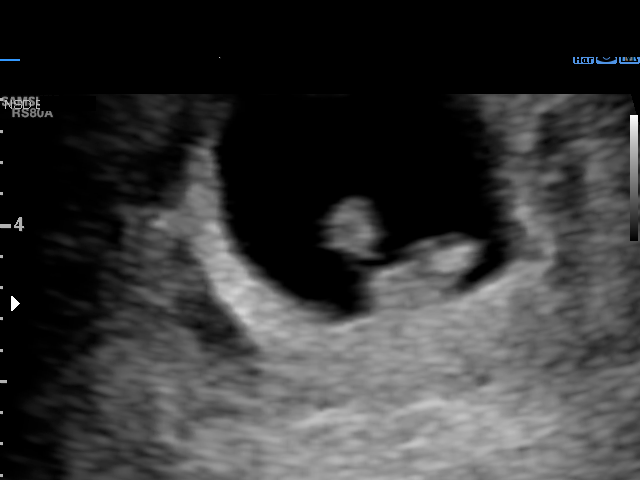
[im 12/24]
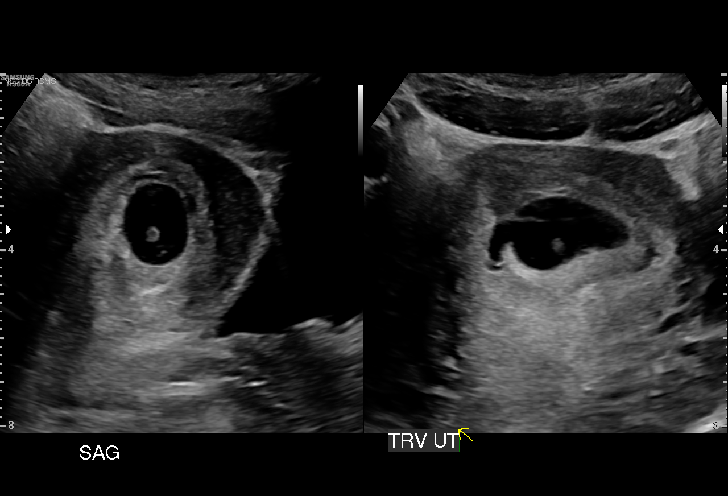
[im 13/24]
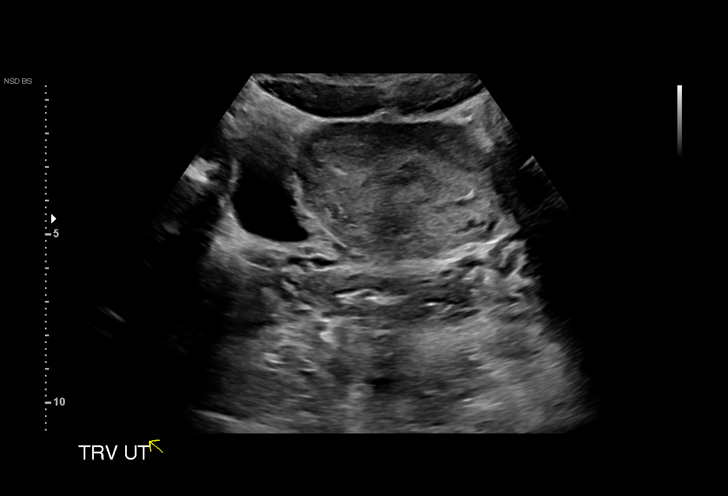
[im 15/24]
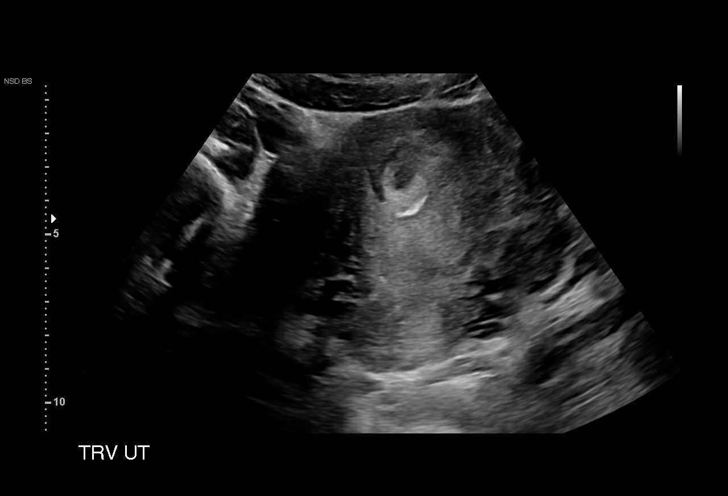
[im 16/24]
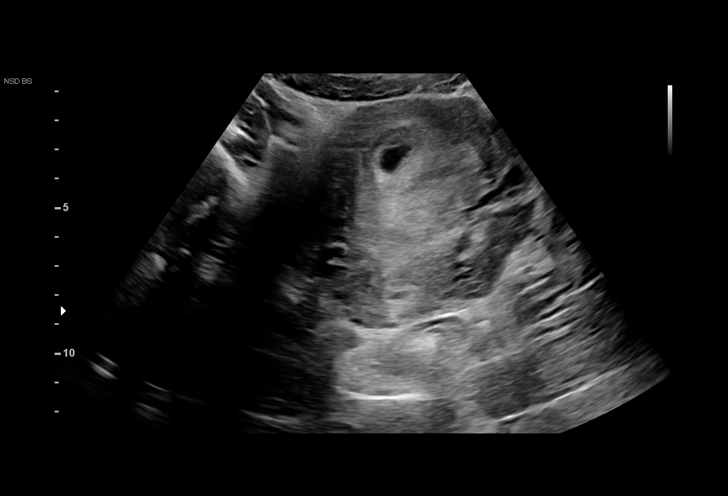
[im 18/24]
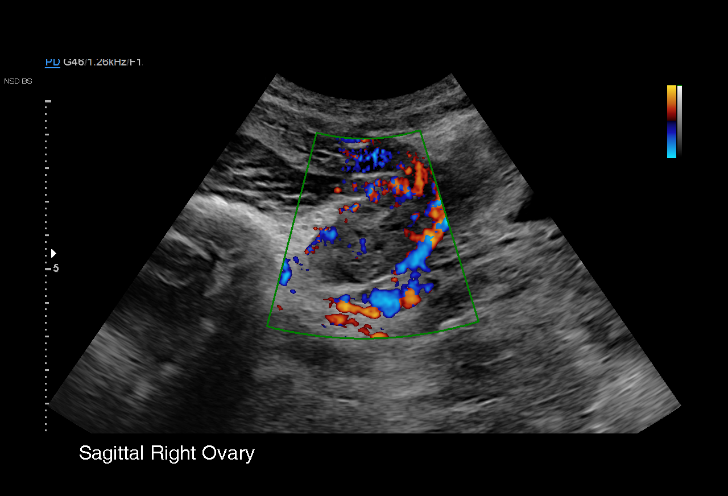
[im 19/24]
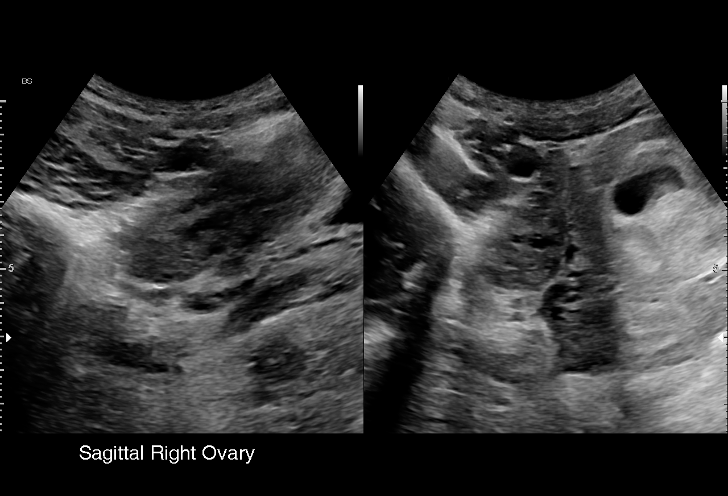
[im 21/24]
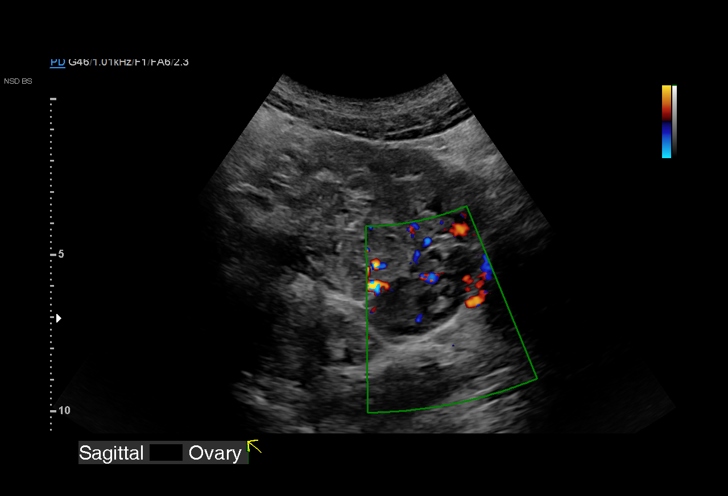
[im 22/24]
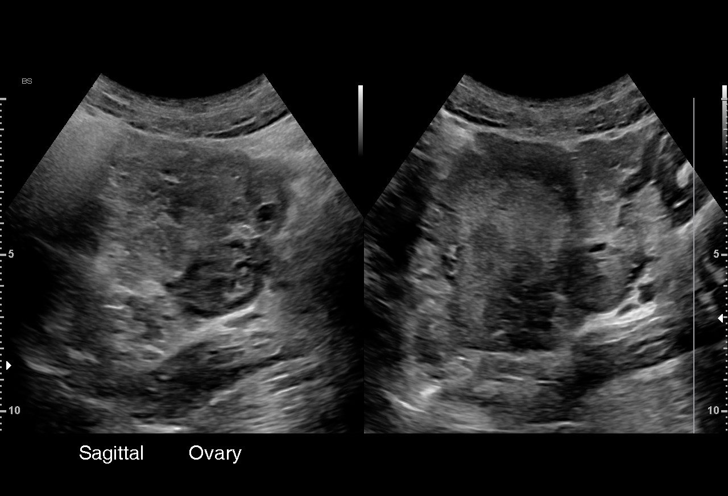
[im 24/24]
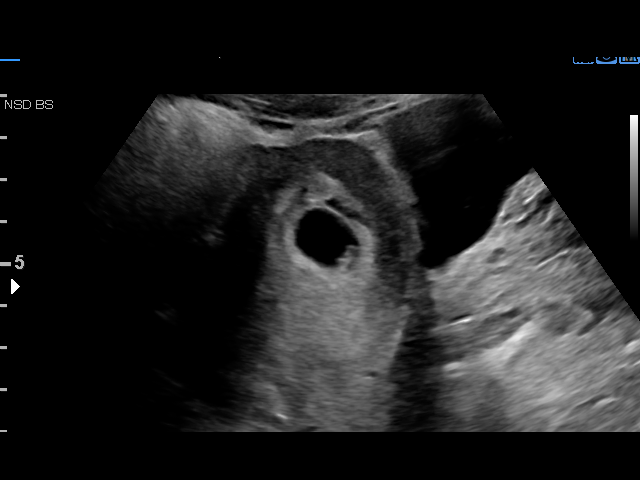

[16 of 24 positions shown; findings below may reference images not displayed]

FINDINGS: Intrauterine gestational sac: Single

Yolk sac:  Visualized.

Embryo:  Visualized.

Cardiac Activity: Visualized.

Heart Rate: 105 bpm

CRL:  8.0 mm   6 w   5 d                  US EDC: 04/29/2020

Subchorionic hemorrhage:  None visualized.

Maternal uterus/adnexae: The ovaries are normal in appearance.
IMPRESSION: Single live intrauterine pregnancy measuring 6 weeks 5 days.

## 2021-12-08 ENCOUNTER — Ambulatory Visit (INDEPENDENT_AMBULATORY_CARE_PROVIDER_SITE_OTHER): Payer: Federal, State, Local not specified - PPO | Admitting: Psychology

## 2021-12-08 DIAGNOSIS — F4321 Adjustment disorder with depressed mood: Secondary | ICD-10-CM | POA: Diagnosis not present
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
## 2021-12-15 DIAGNOSIS — Z113 Encounter for screening for infections with a predominantly sexual mode of transmission: Secondary | ICD-10-CM | POA: Diagnosis not present

## 2021-12-15 DIAGNOSIS — Z23 Encounter for immunization: Secondary | ICD-10-CM | POA: Diagnosis not present

## 2021-12-15 DIAGNOSIS — Z6822 Body mass index (BMI) 22.0-22.9, adult: Secondary | ICD-10-CM | POA: Diagnosis not present

## 2021-12-15 DIAGNOSIS — Z01419 Encounter for gynecological examination (general) (routine) without abnormal findings: Secondary | ICD-10-CM | POA: Diagnosis not present

## 2021-12-15 DIAGNOSIS — Z124 Encounter for screening for malignant neoplasm of cervix: Secondary | ICD-10-CM | POA: Diagnosis not present

## 2021-12-15 DIAGNOSIS — Z01411 Encounter for gynecological examination (general) (routine) with abnormal findings: Secondary | ICD-10-CM | POA: Diagnosis not present

## 2021-12-15 DIAGNOSIS — Z118 Encounter for screening for other infectious and parasitic diseases: Secondary | ICD-10-CM | POA: Diagnosis not present

## 2021-12-21 ENCOUNTER — Ambulatory Visit: Payer: Federal, State, Local not specified - PPO | Admitting: Psychology

## 2021-12-22 DIAGNOSIS — M9902 Segmental and somatic dysfunction of thoracic region: Secondary | ICD-10-CM | POA: Diagnosis not present

## 2021-12-22 DIAGNOSIS — M5386 Other specified dorsopathies, lumbar region: Secondary | ICD-10-CM | POA: Diagnosis not present

## 2021-12-22 DIAGNOSIS — M9903 Segmental and somatic dysfunction of lumbar region: Secondary | ICD-10-CM | POA: Diagnosis not present

## 2021-12-22 DIAGNOSIS — M9905 Segmental and somatic dysfunction of pelvic region: Secondary | ICD-10-CM | POA: Diagnosis not present

## 2021-12-24 ENCOUNTER — Telehealth: Payer: Self-pay

## 2021-12-24 NOTE — Telephone Encounter (Signed)
Claim form from [place] for services in red folder  ?Last office visit 11/16/21 ?

## 2021-12-30 NOTE — Telephone Encounter (Signed)
I gave this back to you  is a medical records request   ?

## 2021-12-31 DIAGNOSIS — L723 Sebaceous cyst: Secondary | ICD-10-CM | POA: Diagnosis not present

## 2022-01-04 DIAGNOSIS — S91209A Unspecified open wound of unspecified toe(s) with damage to nail, initial encounter: Secondary | ICD-10-CM | POA: Diagnosis not present

## 2022-01-05 ENCOUNTER — Encounter: Payer: Self-pay | Admitting: Emergency Medicine

## 2022-01-05 ENCOUNTER — Ambulatory Visit
Admission: EM | Admit: 2022-01-05 | Discharge: 2022-01-05 | Disposition: A | Discharge status: Home / Self Care | Payer: Federal, State, Local not specified - PPO

## 2022-01-05 DIAGNOSIS — S99929A Unspecified injury of unspecified foot, initial encounter: Secondary | ICD-10-CM

## 2022-01-05 DIAGNOSIS — S90931A Unspecified superficial injury of right great toe, initial encounter: Secondary | ICD-10-CM

## 2022-01-05 NOTE — Discharge Instructions (Signed)
Keep this wrapped so that the toenail is protected and lying flat.  Call to schedule appointment with podiatry as soon as possible.  If anything worsens please return for reevaluation. ?

## 2022-01-05 NOTE — ED Triage Notes (Signed)
Patient states that she hit her right big toe going upstairs at school yesterday.  The big toenail has acrylic on it which is lifting off.  Patient has been taken Ibuprofen for pain. ?

## 2022-01-05 NOTE — ED Provider Notes (Signed)
?[facility] ? ? ? ?CSN: [id number] ?Arrival date & time: 01/05/22  0820 ? ? ?  ? ?History   ?Chief Complaint ?Chief Complaint  ?Patient presents with  ? Toe Injury  ? ? ?HPI ?Helen Fletcher is a 21 y.o. female.  ? ?Patient presents today with a several day history of right great toe nail pain.  Reports that she tripped on the stairs at her [employer] ([employer]) which caused her toenail to left.  She has acrylics on her toenail and believes that this pulled out part of the nailbed.  She reports pain is rated 8 on a 0-10 pain scale, described as aching, no aggravating relieving factors identified.  She has cleaned this and wrapped this prior to being seen.  She is confident that she did not hurt the actual toe denies any significant pain, numbness, bruising, swelling.  She is having trouble ambulating only due to pain at toenail. ? ? ?Past Medical History:  ?Diagnosis Date  ? Allergy   ? Anemia   ? ? ?Patient Active Problem List  ? Diagnosis Date Noted  ? Seasonal allergic rhinitis due to pollen 02/09/2017  ? ? ?History reviewed. No pertinent surgical history. ? ?OB History   ? ? Gravida  ?2  ? Para  ?   ? Term  ?   ? Preterm  ?   ? AB  ?1  ? Living  ?   ?  ? ? SAB  ?   ? IAB  ?1  ? Ectopic  ?   ? Multiple  ?   ? Live Births  ?   ?   ?  ?  ? ? ? ?Home Medications   ? ?Prior to Admission medications   ?Medication Sig Start Date End Date Taking? Authorizing Provider  ?hydrOXYzine (ATARAX) 25 MG tablet Take 1 tablet (25 mg total) by mouth at bedtime as needed (insomnia). ?Patient not taking: Reported on 11/16/2021 11/11/21   Gustavus Bryant, FNP  ? ? ?Family History ?Family History  ?Problem Relation Age of Onset  ? Diabetes Mother   ?     type 2   ? Heart disease Father   ?      cabd in 5s   ? Crohn's disease Maternal Uncle   ? Breast cancer Maternal Grandmother   ? ? ?Social History ?Social History  ? ?Tobacco Use  ? Smoking status: Never  ? Smokeless tobacco: Never  ?Vaping Use  ? Vaping Use: Never used   ?Substance Use Topics  ? Alcohol use: Yes  ? Drug use: Never  ? ? ? ?Allergies   ?Amoxicillin and Penicillins ? ? ?Review of Systems ?Review of Systems  ?Constitutional:  Positive for activity change. Negative for appetite change, fatigue and fever.  ?Musculoskeletal:  Negative for arthralgias and myalgias.  ?Skin:  Positive for wound. Negative for color change.  ?Neurological:  Negative for dizziness, weakness, light-headedness, numbness and headaches.  ? ? ?Physical Exam ?Triage Vital Signs ?ED Triage Vitals  ?Enc Vitals Group  ?   BP 01/05/22 0908 123/78  ?   Pulse Rate 01/05/22 0908 77  ?   Resp 01/05/22 0908 18  ?   Temp 01/05/22 0908 98.3 ?F (36.8 ?C)  ?   Temp Source 01/05/22 0908 Oral  ?   SpO2 01/05/22 0908 98 %  ?   Weight 01/05/22 0910 127 lb (57.6 kg)  ?   Height 01/05/22 0910 5\' 3"  (1.6 m)  ?  Head Circumference --   ?   Peak Flow --   ?   Pain Score 01/05/22 0909 8  ?   Pain Loc --   ?   Pain Edu? --   ?   Excl. in GC? --   ? ?No data found. ? ?Updated Vital Signs ?BP 123/78 (BP Location: Right Arm)   Pulse 77   Temp 98.3 ?F (36.8 ?C) (Oral)   Resp 18   Ht 5\' 3"  (1.6 m)   Wt 127 lb (57.6 kg)   LMP 01/04/2022   SpO2 98%   Breastfeeding No   BMI 22.50 kg/m?  ? ?Visual Acuity ?Right Eye Distance:   ?Left Eye Distance:   ?Bilateral Distance:   ? ?Right Eye Near:   ?Left Eye Near:    ?Bilateral Near:    ? ?Physical Exam ?Vitals reviewed.  ?Constitutional:   ?   General: She is awake. She is not in acute distress. ?   Appearance: Normal appearance. She is well-developed. She is not ill-appearing.  ?   Comments: Very pleasant female appears stated age in no acute distress sitting comfortably in exam room  ?HENT:  ?   Head: Normocephalic and atraumatic.  ?Cardiovascular:  ?   Rate and Rhythm: Normal rate and regular rhythm.  ?   Heart sounds: Normal heart sounds, S1 normal and S2 normal. No murmur heard. ?Pulmonary:  ?   Effort: Pulmonary effort is normal.  ?   Breath sounds: Normal breath sounds.  No wheezing, rhonchi or rales.  ?   Comments: Clear to auscultation bilaterally ?Feet:  ?   Right foot:  ?   Skin integrity: No ulcer, blister or skin breakdown.  ?   Comments: Lifted and tender right great toenail with lateral nail edge still attached.  Foot neurovascularly intact. ?Psychiatric:     ?   Behavior: Behavior is cooperative.  ? ? ? ?UC Treatments / Results  ?Labs ?(all labs ordered are listed, but only abnormal results are displayed) ?Labs Reviewed - No data to display ? ?EKG ? ? ?Radiology ?No results found. ? ?Procedures ?Procedures (including critical care time) ? ?Medications Ordered in UC ?Medications - No data to display ? ?Initial Impression / Assessment and Plan / UC Course  ?I have reviewed the triage vital signs and the nursing notes. ? ?Pertinent labs & imaging results that were available during my care of the patient were reviewed by me and considered in my medical decision making (see chart for details). ? ?  ? ?Given minimal continued attachment discussed possible treatment options including removing toenail in office, monitoring it, referring to podiatry.  Patient originally requested this be removed but discussed that I could not guarantee this would grow back normally and there could be cosmetic deformity and so patient declined in office procedure.  Patient was given information for podiatry and encouraged to call to schedule appointment soon as possible.  We wrapped the toe to protect nail.  Recommended patient allow this to grow out if she does not see podiatry and keep it trimmed to prevent reinjury.  Strict return precautions given to which she expressed understanding. ? ?Final Clinical Impressions(s) / UC Diagnoses  ? ?Final diagnoses:  ?Injury of great toenail  ? ? ? ?Discharge Instructions   ? ?  ?Keep this wrapped so that the toenail is protected and lying flat.  Call to schedule appointment with podiatry as soon as possible.  If anything worsens please return for  reevaluation. ? ? ? ?  ED Prescriptions   ?None ?  ? ?PDMP not reviewed this encounter. ?  ?Jeani Hawking, PA-C ?01/05/22 6834 ? ?

## 2022-01-07 ENCOUNTER — Ambulatory Visit: Payer: Federal, State, Local not specified - PPO | Admitting: Podiatry

## 2022-01-13 ENCOUNTER — Encounter: Payer: Self-pay | Admitting: Podiatry

## 2022-01-13 ENCOUNTER — Ambulatory Visit (INDEPENDENT_AMBULATORY_CARE_PROVIDER_SITE_OTHER): Payer: Federal, State, Local not specified - PPO | Admitting: Podiatry

## 2022-01-13 DIAGNOSIS — S91209A Unspecified open wound of unspecified toe(s) with damage to nail, initial encounter: Secondary | ICD-10-CM

## 2022-01-13 DIAGNOSIS — M79674 Pain in right toe(s): Secondary | ICD-10-CM

## 2022-01-13 NOTE — Patient Instructions (Signed)

## 2022-01-13 NOTE — Progress Notes (Signed)
  Subjective:  Patient ID: Helen Fletcher, female    DOB: [birth year],   MRN: [id number]  Chief Complaint  Patient presents with   Toe Injury    toe great injured when toe was stumped    21 y.o. female presents for concern of right great toe nail. Relates about a weeks ago she injured the nail going up the stairs and it came loose. She was seen in a couple urgent cares and advised to come here. Relates she has jammed it a couple times and it has bled.  . Denies any other pedal complaints. Denies n/v/f/c.   Past Medical History:  Diagnosis Date   Allergy    Anemia     Objective:  Physical Exam: Vascular: DP/PT pulses 2/4 bilateral. CFT <3 seconds. Normal hair growth on digits. No edema.  Skin. No lacerations or abrasions bilateral feet. Right hallux nail unattached to nail bed except at the proximal lateral edge. No erythema edema or purulence noted. Underlying nail bed healing well.  Musculoskeletal: MMT 5/5 bilateral lower extremities in DF, PF, Inversion and Eversion. Deceased ROM in DF of ankle joint.  Neurological: Sensation intact to light touch.   Assessment:   1. Pain of toe of right foot      Plan:  Patient was evaluated and treated and all questions answered. Discussed traumatic avulsion of the nail. Discussed with patient removing nail vs just trimming back.  Debrided hallux nail and upon debridement entire nail was removed. Underlying nail bed well healed.  Advised to keep soaking and neosporin on for next couple days.  Return as needed.   Louann Sjogren, DPM

## 2022-01-18 ENCOUNTER — Telehealth: Payer: Self-pay

## 2022-01-18 NOTE — Telephone Encounter (Signed)
Form from [employer] placed in red folder

## 2022-01-19 NOTE — Telephone Encounter (Signed)
This seems like med records   please get  admin to address ibuprofen put on your desk

## 2022-02-15 NOTE — Progress Notes (Deleted)
No chief complaint on file.   HPI: Helen Fletcher 21 y.o. come in for ROS: See pertinent positives and negatives per HPI.  Past Medical History:  Diagnosis Date   Allergy    Anemia     Family History  Problem Relation Age of Onset   Diabetes Mother        type 2    Heart disease Father         cabd in 75s    Crohn's disease Maternal Uncle    Breast cancer Maternal Grandmother     Social History   Socioeconomic History   Marital status: Single    Spouse name: Not on file   Number of children: 0   Years of education: 12   Highest education level: Not on file  Occupational History   Occupation: [employer]  Tobacco Use   Smoking status: Never   Smokeless tobacco: Never  Vaping Use   Vaping Use: Never used  Substance and Sexual Activity   Alcohol use: Yes   Drug use: Never   Sexual activity: Not on file  Other Topics Concern   Not on file  Social History Narrative   hh of 2 cat dog father smokes  dudly HS senir finished  [employer]  Scientist, 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 (Patient not taking: Reported on 11/16/2021) 12 tablet 0   No facility-administered medications prior to visit.     EXAM:  There were no vitals taken for this visit.  There is no height or weight on file to calculate BMI.  GENERAL: vitals reviewed and listed above, alert, oriented, appears well hydrated and in no acute distress HEENT: atraumatic, conjunctiva  clear, no obvious  abnormalities on inspection of external nose and ears OP : no lesion edema or exudate  NECK: no obvious masses on inspection palpation  LUNGS: clear to auscultation bilaterally, no wheezes, rales or rhonchi, good air movement CV: HRRR, no clubbing cyanosis or  peripheral edema nl cap refill  MS: moves all extremities without noticeable focal  abnormality PSYCH: pleasant and cooperative, no obvious depression or anxiety Lab Results  Component Value Date   WBC 5.6 11/16/2021   HGB 12.5 11/16/2021   HCT 36.2 11/16/2021   PLT 223.0 11/16/2021   GLUCOSE 79 11/16/2021   CHOL 291 (H) 11/16/2021   TRIG 146.0 11/16/2021   HDL 59.90 11/16/2021   LDLCALC 202 (H) 11/16/2021   ALT 12 11/16/2021   AST 17 11/16/2021   NA 135 11/16/2021   K 4.3 11/16/2021   CL 101 11/16/2021   CREATININE 0.91 11/16/2021   BUN 14 11/16/2021   CO2 29 11/16/2021   TSH 0.57 11/16/2021   HGBA1C 5.9 11/16/2021   BP Readings from Last 3 Encounters:  01/05/22 123/78  11/16/21 110/70  11/11/21 118/80    ASSESSMENT AND PLAN:  Discussed the following assessment and plan:  Elevated cholesterol  -Patient advised to return or notify health care team  if  new concerns arise.  There are no Patient Instructions on file for this visit.   Neta Mends. Rozell Kettlewell M.D.

## 2022-02-16 ENCOUNTER — Ambulatory Visit: Payer: Federal, State, Local not specified - PPO | Admitting: Internal Medicine

## 2022-02-16 ENCOUNTER — Telehealth: Payer: Managed Care, Other (non HMO) | Admitting: Internal Medicine

## 2022-02-16 DIAGNOSIS — E7800 Pure hypercholesterolemia, unspecified: Secondary | ICD-10-CM

## 2022-03-03 DIAGNOSIS — M5386 Other specified dorsopathies, lumbar region: Secondary | ICD-10-CM | POA: Diagnosis not present

## 2022-03-03 DIAGNOSIS — M9902 Segmental and somatic dysfunction of thoracic region: Secondary | ICD-10-CM | POA: Diagnosis not present

## 2022-03-03 DIAGNOSIS — M9903 Segmental and somatic dysfunction of lumbar region: Secondary | ICD-10-CM | POA: Diagnosis not present

## 2022-03-03 DIAGNOSIS — M9905 Segmental and somatic dysfunction of pelvic region: Secondary | ICD-10-CM | POA: Diagnosis not present

## 2023-04-25 NOTE — Progress Notes (Unsigned)
No chief complaint on file.   HPI: Patient  Helen Fletcher  22 y.o. comes in today for Preventive Health Care visit   Bu had well exam 8 20 [facility]  [place]  on 8 20   Health Maintenance  Topic Date Due   HPV VACCINES (1 - 3-dose series) Never done   PAP-Cervical Cytology Screening  Never done   PAP SMEAR-Modifier  Never done   DTaP/Tdap/Td (7 - Td or Tdap) 03/10/2021   COVID-19 Vaccine (3 - 2023-24 season) 04/29/2022   INFLUENZA VACCINE  03/30/2023   Hepatitis C Screening  Completed   HIV Screening  Completed   Health Maintenance Review LIFESTYLE:  Exercise:   Tobacco/ETS: Alcohol:  Sugar beverages: Sleep: Drug use: no HH of  Work:    ROS:  GEN/ HEENT: No fever, significant weight changes sweats headaches vision problems hearing changes, CV/ PULM; No chest pain shortness of breath cough, syncope,edema  change in exercise tolerance. GI /GU: No adominal pain, vomiting, change in bowel habits. No blood in the stool. No significant GU symptoms. SKIN/HEME: ,no acute skin rashes suspicious lesions or bleeding. No lymphadenopathy, nodules, masses.  NEURO/ PSYCH:  No neurologic signs such as weakness numbness. No depression anxiety. IMM/ Allergy: No unusual infections.  Allergy .   REST of 12 system review negative except as per HPI   Past Medical History:  Diagnosis Date   Allergy    Anemia     No past surgical history on file.  Family History  Problem Relation Age of Onset   Diabetes Mother        type 2    Heart disease Father         cabd in 24s    Crohn's disease Maternal Uncle    Breast cancer Maternal Grandmother     Social History   Socioeconomic History   Marital status: Single    Spouse name: Not on file   Number of children: 0   Years of education: 12   Highest education level: Not on file  Occupational History   Occupation: [employer]  Tobacco Use   Smoking status: Never   Smokeless tobacco: Never  Vaping Use   Vaping status:  Never Used  Substance and Sexual Activity   Alcohol use: Yes   Drug use: Never   Sexual activity: Not on file  Other Topics Concern   Not on file  Social History Narrative   hh of 2 cat dog father smokes  dudly HS senir finished  [employer]  Scientist, 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   And Engineer, materials SR    Father ets    No FA   Social Determinants of Health   Financial Resource Strain: Low Risk  (04/18/2023)   Received from [facility]   Overall Financial Resource Strain (CARDIA)    Difficulty of Paying Living Expenses: Not hard at all  Food Insecurity: No Food Insecurity (04/18/2023)   [facility]   Hunger Vital Sign    Worried About Running Out of Food in the Last Year: Never true    Ran Out of Food in the Last Year: Never true  Transportation Needs: No Transportation Needs (04/18/2023)   Received from [facility] - Transportation    Lack of Transportation (Medical): No    Lack of Transportation (Non-Medical): No  Physical Activity: Insufficiently Active (04/18/2023)   Received  from [facility]   Exercise Vital Sign    Days of Exercise per Week: 2 days    Minutes of Exercise per Session: 20 min  Stress: Stress Concern Present (04/18/2023)   Received from [facility] - Occupational Stress Questionnaire    Feeling of Stress : Very much  Social Connections: Moderately Integrated (04/18/2023)   Received from [facility]   Social Network    How would you rate your social network (family, work, friends)?: Adequate participation with social networks    Outpatient Medications Prior to Visit  Medication Sig Dispense Refill   hydrOXYzine (ATARAX) 25 MG tablet Take 1 tablet (25 mg total) by mouth at bedtime as needed (insomnia). (Patient not taking: Reported on 11/16/2021) 12 tablet 0   No facility-administered medications prior to visit.     EXAM:  There  were no vitals taken for this visit.  There is no height or weight on file to calculate BMI. Wt Readings from Last 3 Encounters:  01/05/22 127 lb (57.6 kg)  11/16/21 126 lb 12.8 oz (57.5 kg)  08/27/19 119 lb (54 kg) (33%, Z= -0.45)*   * Growth percentiles are based on CDC (Girls, 2-20 Years) data.    Physical Exam: Vital signs reviewed WUJ:WJXB is a well-developed well-nourished alert cooperative    who appearsr stated age in no acute distress.  HEENT: normocephalic atraumatic , Eyes: PERRL EOM's full, conjunctiva clear, Nares: paten,t no deformity discharge or tenderness., Ears: no deformity EAC's clear TMs with normal landmarks. Mouth: clear OP, no lesions, edema.  Moist mucous membranes. Dentition in adequate repair. NECK: supple without masses, thyromegaly or bruits. CHEST/PULM:  Clear to auscultation and percussion breath sounds equal no wheeze , rales or rhonchi. No chest wall deformities or tenderness. Breast: normal by inspection . No dimpling, discharge, masses, tenderness or discharge . CV: PMI is nondisplaced, S1 S2 no gallops, murmurs, rubs. Peripheral pulses are full without delay.No JVD .  ABDOMEN: Bowel sounds normal nontender  No guard or rebound, no hepato splenomegal no CVA tenderness.  No hernia. Extremtities:  No clubbing cyanosis or edema, no acute joint swelling or redness no focal atrophy NEURO:  Oriented x3, cranial nerves 3-12 appear to be intact, no obvious focal weakness,gait within normal limits no abnormal reflexes or asymmetrical SKIN: No acute rashes normal turgor, color, no bruising or petechiae. PSYCH: Oriented, good eye contact, no obvious depression anxiety, cognition and judgment appear normal. LN: no cervical axillary inguinal adenopathy  Lab Results  Component Value Date   WBC 5.6 11/16/2021   HGB 12.5 11/16/2021   HCT 36.2 11/16/2021   PLT 223.0 11/16/2021   GLUCOSE 79 11/16/2021   CHOL 291 (H) 11/16/2021   TRIG 146.0 11/16/2021   HDL 59.90  11/16/2021   LDLCALC 202 (H) 11/16/2021   ALT 12 11/16/2021   AST 17 11/16/2021   NA 135 11/16/2021   K 4.3 11/16/2021   CL 101 11/16/2021   CREATININE 0.91 11/16/2021   BUN 14 11/16/2021   CO2 29 11/16/2021   TSH 0.57 11/16/2021   HGBA1C 5.9 11/16/2021    BP Readings from Last 3 Encounters:  01/05/22 123/78  11/16/21 110/70  11/11/21 118/80    Lab results reviewed with patient   ASSESSMENT AND PLAN:  Discussed the following assessment and plan:    ICD-10-CM   1. Visit for preventive health examination  Z00.00      No follow-ups on file.  Patient Care Team:  Emary Zalar, Neta Mends, MD as PCP - General (Internal Medicine) There are no Patient Instructions on file for this visit.  Neta Mends. Breshay Ilg M.D.

## 2023-04-26 ENCOUNTER — Ambulatory Visit (INDEPENDENT_AMBULATORY_CARE_PROVIDER_SITE_OTHER): Payer: Federal, State, Local not specified - PPO | Admitting: Internal Medicine

## 2023-04-26 VITALS — BP 110/72 | HR 75 | Temp 98.2°F | Ht 63.0 in | Wt 114.0 lb

## 2023-04-26 DIAGNOSIS — E785 Hyperlipidemia, unspecified: Secondary | ICD-10-CM | POA: Diagnosis not present

## 2023-04-26 DIAGNOSIS — Z634 Disappearance and death of family member: Secondary | ICD-10-CM

## 2023-04-26 DIAGNOSIS — Z79899 Other long term (current) drug therapy: Secondary | ICD-10-CM

## 2023-04-26 DIAGNOSIS — Z0000 Encounter for general adult medical examination without abnormal findings: Secondary | ICD-10-CM

## 2023-04-26 LAB — LIPID PANEL
Cholesterol: 253 mg/dL — ABNORMAL HIGH (ref 0–200)
HDL: 57.8 mg/dL (ref >39.00)
LDL Cholesterol: 183 mg/dL — ABNORMAL HIGH (ref 0–99)
NonHDL: 195.3
Total CHOL/HDL Ratio: 4
Triglycerides: 62 mg/dL (ref 0.0–149.0)
VLDL: 12.4 mg/dL (ref 0.0–40.0)

## 2023-04-26 NOTE — Patient Instructions (Signed)
Condolences about your dad/. Continue bereavement  counseling .  Getting  more detailed lipid profile today  Your other labs done previously are good.   Mediterranean eating  get enough sleep  exercise as tolerated . And plan fu  Usual guidelines are to use statin medication if ldl is over 190  However can follow at this time Statin medications should not be used ever if pregnancy is a risk .

## 2023-04-28 LAB — NMR, LIPOPROFILE
Cholesterol, Total: 263 mg/dL — ABNORMAL HIGH (ref 100–199)
HDL Particle Number: 28.5 umol/L — ABNORMAL LOW (ref >=30.5)
HDL-C: 66 mg/dL (ref >39)
LDL Particle Number: 1806 nmol/L — ABNORMAL HIGH (ref <1000)
LDL Size: 21.9 nm (ref >20.5)
LDL-C (NIH Calc): 190 mg/dL — ABNORMAL HIGH (ref 0–99)
LP-IR Score: <25 (ref <=45)
Small LDL Particle Number: 357 nmol/L (ref <=527)
Triglycerides: 51 mg/dL (ref 0–149)

## 2023-04-29 LAB — LIPOPROTEIN A (LPA): Lipoprotein (a): 190 nmol/L — ABNORMAL HIGH (ref <75)

## 2023-05-22 NOTE — Progress Notes (Signed)
Sorry for late response . Cholesterol is still pretty high  ( probably hereditary risk ) Guidelines advise possible medication  or closer follow up for this to prevent future heart disease.    As stated before  some of these meds should never be used  if risk of pregnancy, so not as easy to prescribe.   I suggest  if  you agree to see cardiology or  be seen in lipid clinic to discuss  with you what life long  interventions would be the most helpful to stay healthy . If you agree  please team do a referral  to cardiologyfor risk assessment .  (If wishes  can do a virtual visit with me with ?s)

## 2024-05-16 ENCOUNTER — Encounter (HOSPITAL_BASED_OUTPATIENT_CLINIC_OR_DEPARTMENT_OTHER): Payer: Self-pay | Admitting: Emergency Medicine

## 2024-05-16 ENCOUNTER — Emergency Department (HOSPITAL_BASED_OUTPATIENT_CLINIC_OR_DEPARTMENT_OTHER)
Admission: EM | Admit: 2024-05-16 | Discharge: 2024-05-16 | Disposition: A | Discharge status: Home / Self Care | Attending: Emergency Medicine | Admitting: Emergency Medicine

## 2024-05-16 ENCOUNTER — Other Ambulatory Visit (HOSPITAL_BASED_OUTPATIENT_CLINIC_OR_DEPARTMENT_OTHER): Payer: Self-pay

## 2024-05-16 ENCOUNTER — Other Ambulatory Visit: Payer: Self-pay

## 2024-05-16 DIAGNOSIS — E860 Dehydration: Secondary | ICD-10-CM | POA: Insufficient documentation

## 2024-05-16 DIAGNOSIS — R112 Nausea with vomiting, unspecified: Secondary | ICD-10-CM | POA: Diagnosis not present

## 2024-05-16 DIAGNOSIS — F10129 Alcohol abuse with intoxication, unspecified: Secondary | ICD-10-CM | POA: Insufficient documentation

## 2024-05-16 DIAGNOSIS — F10120 Alcohol abuse with intoxication, uncomplicated: Secondary | ICD-10-CM

## 2024-05-16 DIAGNOSIS — Z79899 Other long term (current) drug therapy: Secondary | ICD-10-CM | POA: Insufficient documentation

## 2024-05-16 DIAGNOSIS — R1013 Epigastric pain: Secondary | ICD-10-CM | POA: Diagnosis not present

## 2024-05-16 DIAGNOSIS — R519 Headache, unspecified: Secondary | ICD-10-CM | POA: Diagnosis present

## 2024-05-16 LAB — CBC WITH DIFFERENTIAL/PLATELET
Abs Immature Granulocytes: 0.02 K/uL (ref 0.00–0.07)
Basophils Absolute: 0 K/uL (ref 0.0–0.1)
Basophils Relative: 1 %
Eosinophils Absolute: 0 K/uL (ref 0.0–0.5)
Eosinophils Relative: 1 %
HCT: 36.4 % (ref 36.0–46.0)
Hemoglobin: 12.5 g/dL (ref 12.0–15.0)
Immature Granulocytes: 0 %
Lymphocytes Relative: 24 %
Lymphs Abs: 1.6 K/uL (ref 0.7–4.0)
MCH: 29.6 pg (ref 26.0–34.0)
MCHC: 34.3 g/dL (ref 30.0–36.0)
MCV: 86.3 fL (ref 80.0–100.0)
Monocytes Absolute: 0.4 K/uL (ref 0.1–1.0)
Monocytes Relative: 6 %
Neutro Abs: 4.6 K/uL (ref 1.7–7.7)
Neutrophils Relative %: 68 %
Platelets: 235 K/uL (ref 150–400)
RBC: 4.22 MIL/uL (ref 3.87–5.11)
RDW: 13 % (ref 11.5–15.5)
WBC: 6.6 K/uL (ref 4.0–10.5)
nRBC: 0 % (ref 0.0–0.2)

## 2024-05-16 LAB — COMPREHENSIVE METABOLIC PANEL WITH GFR
ALT: 20 U/L (ref 0–44)
AST: 31 U/L (ref 15–41)
Albumin: 4.8 g/dL (ref 3.5–5.0)
Alkaline Phosphatase: 48 U/L (ref 38–126)
Anion gap: 14 (ref 5–15)
BUN: 14 mg/dL (ref 6–20)
CO2: 23 mmol/L (ref 22–32)
Calcium: 9.7 mg/dL (ref 8.9–10.3)
Chloride: 104 mmol/L (ref 98–111)
Creatinine, Ser: 0.81 mg/dL (ref 0.44–1.00)
GFR, Estimated: >60 mL/min (ref >60)
Glucose, Bld: 105 mg/dL — ABNORMAL HIGH (ref 70–99)
Potassium: 4.5 mmol/L (ref 3.5–5.1)
Sodium: 141 mmol/L (ref 135–145)
Total Bilirubin: <0.2 mg/dL (ref 0.0–1.2)
Total Protein: 7.5 g/dL (ref 6.5–8.1)

## 2024-05-16 LAB — LIPASE, BLOOD: Lipase: 17 U/L (ref 11–51)

## 2024-05-16 MED ORDER — ONDANSETRON 4 MG PO TBDP
4.0000 mg | ORAL_TABLET | Freq: Three times a day (TID) | ORAL | 0 refills | Status: AC | PRN
  Filled 2024-05-16: qty 20, 7d supply, fill #0

## 2024-05-16 MED ORDER — ONDANSETRON HCL 4 MG/2ML IJ SOLN
4.0000 mg | Freq: Once | INTRAMUSCULAR | Status: AC
  Administered 2024-05-16: 4 mg via INTRAVENOUS
  Filled 2024-05-16: qty 2

## 2024-05-16 MED ORDER — KETOROLAC TROMETHAMINE 15 MG/ML IJ SOLN: 15.0000 mg | Freq: Once | INTRAMUSCULAR | Status: DC

## 2024-05-16 MED ORDER — FAMOTIDINE IN NACL 20-0.9 MG/50ML-% IV SOLN
20.0000 mg | Freq: Once | INTRAVENOUS | Status: AC
  Administered 2024-05-16: 20 mg via INTRAVENOUS
  Filled 2024-05-16: qty 50

## 2024-05-16 MED ORDER — SODIUM CHLORIDE 0.9 % IV BOLUS
1000.0000 mL | Freq: Once | INTRAVENOUS | Status: AC
  Administered 2024-05-16: 1000 mL via INTRAVENOUS

## 2024-05-16 NOTE — ED Provider Notes (Signed)
 [place] [facility] DE[facility] AT [facility] Provider Note   CSN: [id number] Arrival date & time: 05/16/24  1058     Patient presents with: Emesis   Helen Fletcher is a 23 y.o. female patient who presents to the emergency department today for further evaluation of a hangover.  Patient states that she drinks 5-7 mixed drinks with various amounts of alcohol in them.  She woke up this morning with a intense headache, intractable nausea and vomiting, and a couple episodes of diarrhea.  She does endorse some mild epigastric pain as well.  Patient unable to tolerate any fluids at this time.    Emesis      Prior to Admission medications   Medication Sig Start Date End Date Taking? Authorizing Provider  ondansetron  (ZOFRAN -ODT) 4 MG disintegrating tablet Take 1 tablet (4 mg total) by mouth every 8 (eight) hours as needed for nausea or vomiting. 05/16/24  Yes Tresean Mattix M, PA-C  sertraline (ZOLOFT) 25 MG tablet Take by mouth daily. 04/18/23   [provider]    Allergies: Amoxicillin  and Penicillins    Review of Systems  Gastrointestinal:  Positive for vomiting.  All other systems reviewed and are negative.   Updated Vital Signs BP 111/70   Pulse 95   Temp 98.2 F (36.8 C)   Resp 17   Ht 5' 3 (1.6 m)   Wt 51.7 kg   LMP 05/05/2024 (Exact Date)   SpO2 100%   BMI 20.19 kg/m   Physical Exam Vitals and nursing note reviewed.  Constitutional:      General: She is not in acute distress.    Appearance: Normal appearance.  HENT:     Head: Normocephalic and atraumatic.  Eyes:     General:        Right eye: No discharge.        Left eye: No discharge.  Cardiovascular:     Comments: Regular rate and rhythm.  S1/S2 are distinct without any evidence of murmur, rubs, or gallops.  Radial pulses are 2+ bilaterally.  Dorsalis pedis pulses are 2+ bilaterally.  No evidence of pedal edema. Pulmonary:     Comments: Clear to auscultation bilaterally.  Normal  effort.  No respiratory distress.  No evidence of wheezes, rales, or rhonchi heard throughout. Abdominal:     General: Abdomen is flat. Bowel sounds are normal. There is no distension.     Tenderness: There is no abdominal tenderness. There is no guarding or rebound.  Musculoskeletal:        General: Normal range of motion.     Cervical back: Neck supple.  Skin:    General: Skin is warm and dry.     Findings: No rash.  Neurological:     General: No focal deficit present.     Mental Status: She is alert.  Psychiatric:        Mood and Affect: Mood normal.        Behavior: Behavior normal.     (all labs ordered are listed, but only abnormal results are displayed) Labs Reviewed  COMPREHENSIVE METABOLIC PANEL WITH GFR - Abnormal; Notable for the following components:      Result Value   Glucose, Bld 105 (*)    All other components within normal limits  CBC WITH DIFFERENTIAL/PLATELET  LIPASE, BLOOD    EKG: None  Radiology: No results found.   Procedures   Medications Ordered in the ED  famotidine  (PEPCID ) IVPB 20 mg premix (0 mg Intravenous  Stopped 05/16/24 1233)  ondansetron  (ZOFRAN ) injection 4 mg (4 mg Intravenous Given 05/16/24 1152)  sodium chloride  0.9 % bolus 1,000 mL (1,000 mLs Intravenous New Bag/Given 05/16/24 1153)    Clinical Course as of 05/16/24 1329  Thu May 16, 2024  1326 CBC with Differential Negative. [CF]  1326 Comprehensive metabolic panel(!) Negative [CF]  1326 Patient feeling much better after fluids, Pepcid , and Zofran .  We discussed about binge drinking.  Patient expressed full understanding.  All questions or concerns addressed. [CF]    Clinical Course User Index [CF] Theotis Cameron HERO, PA-C    Medical Decision Making Helen Fletcher is a 23 y.o. female patient who presents to the emergency department today for further evaluation of hangover.  Low suspicion for any intra-abdominal emergent pathology at this time.  Also less concern for  any intracranial hemorrhage or emergent causes at this time.  Will plan to give patient fluids, Pepcid , and Zofran .  As highlighted in the ED course, patient is feeling better.  Will plan to discharge home.  Strict return precautions were discussed.  She is safe for discharge at this time.   Amount and/or Complexity of Data Reviewed Labs: ordered. Decision-making details documented in ED Course.  Risk Prescription drug management.    Final diagnoses:  Hangover without complication (HCC)  Dehydration    ED Discharge Orders          Ordered    ondansetron  (ZOFRAN -ODT) 4 MG disintegrating tablet  Every 8 hours PRN        05/16/24 1328               Theotis Cameron [place], [place] 05/16/24 1329    Helen Jerilynn RAMAN, MD 05/17/24 1431

## 2024-05-16 NOTE — ED Notes (Signed)
 Pt given discharge instructions and reviewed prescriptions. Opportunities given for questions. Pt verbalizes understanding. PIV removed x1. Bethena Powell SAUNDERS, RN

## 2024-05-16 NOTE — Discharge Instructions (Signed)
 As we discussed, please do not mix different types of alcohol.  I would avoid binge drinking.  I have prescribed you some Zofran  which can help with the nausea if it comes back.  Please drink plenty of fluids and get plenty of rest.  You may return to the emergency department for any worsening symptoms.

## 2024-05-16 NOTE — ED Triage Notes (Signed)
 Pt via pov from home with emesis and diarrhea today, along with a headache. Pt reports she is really hung over and this is worse than any hangover before. Pt's last drink between 1-2AM. She reports that she drinks like this on occasion. Pt a&o x 4; nad noted.

## 2024-05-27 ENCOUNTER — Other Ambulatory Visit (HOSPITAL_BASED_OUTPATIENT_CLINIC_OR_DEPARTMENT_OTHER): Payer: Self-pay
